# Patient Record
Sex: Female | Born: 2000
Health system: Southern US, Community
[De-identification: ages and names within clinical notes are randomized; demographics above are authoritative.]

## PROBLEM LIST (undated history)

## (undated) DIAGNOSIS — R109 Unspecified abdominal pain: Secondary | ICD-10-CM

## (undated) DIAGNOSIS — R197 Diarrhea, unspecified: Secondary | ICD-10-CM

## (undated) DIAGNOSIS — N390 Urinary tract infection, site not specified: Secondary | ICD-10-CM

## (undated) DIAGNOSIS — R51 Headache: Secondary | ICD-10-CM

## (undated) DIAGNOSIS — M2142 Flat foot [pes planus] (acquired), left foot: Secondary | ICD-10-CM

## (undated) DIAGNOSIS — F32A Depression, unspecified: Secondary | ICD-10-CM

## (undated) DIAGNOSIS — G43909 Migraine, unspecified, not intractable, without status migrainosus: Secondary | ICD-10-CM

## (undated) DIAGNOSIS — R87629 Unspecified abnormal cytological findings in specimens from vagina: Secondary | ICD-10-CM

## (undated) DIAGNOSIS — M419 Scoliosis, unspecified: Secondary | ICD-10-CM

## (undated) DIAGNOSIS — M2141 Flat foot [pes planus] (acquired), right foot: Secondary | ICD-10-CM

## (undated) DIAGNOSIS — M25562 Pain in left knee: Secondary | ICD-10-CM

## (undated) DIAGNOSIS — M357 Hypermobility syndrome: Secondary | ICD-10-CM

## (undated) DIAGNOSIS — F329 Major depressive disorder, single episode, unspecified: Secondary | ICD-10-CM

## (undated) HISTORY — DX: Unspecified abdominal pain: R10.9

## (undated) HISTORY — DX: Urinary tract infection, site not specified: N39.0

## (undated) HISTORY — DX: Flat foot (pes planus) (acquired), left foot: M21.42

## (undated) HISTORY — DX: Flat foot (pes planus) (acquired), right foot: M21.42

## (undated) HISTORY — DX: Depression, unspecified: F32.A

## (undated) HISTORY — DX: Headache: R51

## (undated) HISTORY — DX: Diarrhea, unspecified: R19.7

## (undated) HISTORY — DX: Pain in left knee: M25.562

## (undated) HISTORY — DX: Unspecified abnormal cytological findings in specimens from vagina: R87.629

## (undated) HISTORY — DX: Hypermobility syndrome: M35.7

## (undated) HISTORY — DX: Flat foot (pes planus) (acquired), right foot: M21.41

## (undated) HISTORY — DX: Major depressive disorder, single episode, unspecified: F32.9

---
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
## 2020-02-14 ENCOUNTER — Encounter (INDEPENDENT_AMBULATORY_CARE_PROVIDER_SITE_OTHER): Payer: Self-pay | Admitting: Internal Medicine

## 2020-02-14 ENCOUNTER — Ambulatory Visit (INDEPENDENT_AMBULATORY_CARE_PROVIDER_SITE_OTHER): Payer: 59 | Admitting: Internal Medicine

## 2020-02-14 ENCOUNTER — Other Ambulatory Visit: Payer: Self-pay

## 2020-02-14 VITALS — BP 100/70 | HR 107 | Temp 92.3°F | Ht 61.5 in | Wt 191.0 lb

## 2020-02-14 DIAGNOSIS — R5383 Other fatigue: Secondary | ICD-10-CM

## 2020-02-14 DIAGNOSIS — Z6830 Body mass index (BMI) 30.0-30.9, adult: Secondary | ICD-10-CM | POA: Diagnosis not present

## 2020-02-14 DIAGNOSIS — E559 Vitamin D deficiency, unspecified: Secondary | ICD-10-CM

## 2020-02-14 DIAGNOSIS — R5381 Other malaise: Secondary | ICD-10-CM

## 2020-02-14 DIAGNOSIS — G43829 Menstrual migraine, not intractable, without status migrainosus: Secondary | ICD-10-CM | POA: Diagnosis not present

## 2020-02-14 DIAGNOSIS — Z13220 Encounter for screening for lipoid disorders: Secondary | ICD-10-CM

## 2020-02-14 DIAGNOSIS — F329 Major depressive disorder, single episode, unspecified: Secondary | ICD-10-CM | POA: Diagnosis not present

## 2020-02-14 DIAGNOSIS — F32A Depression, unspecified: Secondary | ICD-10-CM

## 2020-02-14 DIAGNOSIS — Z131 Encounter for screening for diabetes mellitus: Secondary | ICD-10-CM

## 2020-02-14 DIAGNOSIS — N943 Premenstrual tension syndrome: Secondary | ICD-10-CM

## 2020-02-14 MED ORDER — PROGESTERONE 200 MG PO CAPS: 200.0000 mg | ORAL_CAPSULE | Freq: Every day | ORAL | 3 refills | Status: DC

## 2020-02-14 NOTE — Progress Notes (Signed)
Metrics: Intervention Frequency ACO  Documented Smoking Status Yearly  Screened one or more times in 24 months  Cessation Counseling or  Active cessation medication Past 24 months  Past 24 months   Guideline developer: UpToDate (See UpToDate for funding source) Date Released: 2014       Wellness Office Visit  Subjective:  Patient ID: [facility], female    DOB: 15-Feb-2001  Age: 19 y.o. MRN: [id number]  CC: This delightful 19 year old lady comes to our practice as a new patient to establish care.  She is going to turn 19 years old tomorrow. HPI She complains of fatigue, weight gain, constipation. She also complains of premenstrual syndrome and headaches relating to her cycles. She is being treated by psychiatry for bipolar disorder, anxiety.  She has insomnia and takes melatonin which seems to help her. 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  Past Medical History:  Diagnosis Date   Abdominal pain, recurrent    Asthma    since a baby,ususally with anxiety    Bilateral pes planus    Depression    Diagnosed since 9th grade but feels like she has had it since 6th grade   Diarrhea    Headache(784.0)    Hypermobility syndrome    Diagnosed by [facility]    Migraine    Patellofemoral arthralgia of left knee    Scoliosis    Urinary tract infection    Past Surgical History:  Procedure Laterality Date   KNEE ARTHROSCOPY WITH MEDIAL PATELLAR FEMORAL LIGAMENT RECONSTRUCTION Left 07/26/2018   Procedure: LEFT KNEE ARTHROSCOPY WITH MEDIAL PATELLAR FEMORAL LIGAMENT RECONSTRUCTION;  Surgeon: Hiram Gash, MD;  Location: [facility];  Service: Orthopedics;  Laterality: Left;   LABIOPLASTY Left 04/20/2018   Procedure: LEFT LABIAL REDUCTION;  Surgeon: Jonnie Kind, MD;  Location: [facility];  Service: Gynecology;  Laterality: Left;   URETERAL REIMPLANTION  2007   left-sided     Family History  Problem Relation Age  of Onset   Irritable bowel syndrome Brother    Asthma Brother    Ulcers Maternal Grandmother    Depression Maternal Grandmother    Cancer Maternal Grandmother    Hyperlipidemia Maternal Grandmother    Fibromyalgia Maternal Grandmother    Ulcers Paternal Grandfather    Hypertension Paternal Grandfather    Migraines Mother    Bipolar disorder Mother    Depression Mother    Anxiety disorder Mother    Fibromyalgia Mother    Alcohol abuse Maternal Grandfather    Depression Other    Depression Maternal Aunt     Social History   Social History Narrative   Lives with mother .[employer]-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         Social History   Tobacco Use   Smoking status: Former Smoker    Types: Cigarettes, E-cigarettes    Quit date: 07/17/2019    Years since quitting: 0.5   Smokeless tobacco: Never Used   Tobacco comment: mother vapes  Substance Use Topics   Alcohol use: Yes    Comment: once a month    Current Meds  Medication Sig   ascorbic acid (VITAMIN C) 100 MG tablet Take by mouth.   carbamazepine (TEGRETOL) 200 MG tablet Take 1 tablet (200 mg total) by mouth 2 (two) times daily.   clonazePAM (KLONOPIN) 0.5 MG tablet Take 1 tablet (0.5 mg total) by mouth daily as needed for anxiety.  FLUoxetine (PROZAC) 20 MG capsule Take 1 capsule (20 mg total) by mouth daily.   ibuprofen (ADVIL) 600 MG tablet Take 1 tablet (600 mg total) by mouth 3 (three) times daily.   loratadine (CLARITIN) 10 MG tablet Take 10 mg by mouth daily.    melatonin 5 MG TABS Take 5 mg by mouth at bedtime.   nystatin cream (MYCOSTATIN) Apply 1 application topically 2 (two) times daily.   polyethylene glycol powder (GLYCOLAX/MIRALAX) powder Take 17 g by mouth daily.       Depression screen [facility] 2/9 11/24/2019 08/29/2019  Decreased Interest 0 1  Down, Depressed, Hopeless 0 2  PHQ - 2 Score 0 3  Altered sleeping - 3  Tired, decreased energy - 3    Change in appetite - 3  Feeling bad or failure about yourself  - 2  Trouble concentrating - 0  Moving slowly or fidgety/restless - 0  PHQ-9 Score - 14     Objective:   Today's Vitals: BP 100/70 (BP Location: Left Arm, Patient Position: Sitting, Cuff Size: Normal)    Pulse (!) 107    Temp (!) 92.3 F (33.5 C) (Temporal)    Ht 5' 1.5" (1.562 m)    Wt 191 lb (86.6 kg)    SpO2 97%    BMI 35.50 kg/m  Vitals with BMI 02/14/2020 12/15/2019 11/24/2019  Height 5' 1.5" - 5\' 1"   Weight 191 lbs 193 lbs 192 lbs  BMI 35.51 36.49 36.3  Systolic 100 - 125  Diastolic 70 - 76  Pulse 107 -  Some encounter information is confidential and restricted. Go to Review Flowsheets activity to see all data.     Physical Exam   She looks systemically well.  She is obese.  Blood pressure is good.    Assessment   1. Menstrual migraine without status migrainosus, not intractable   2. Adolescent depression   3. Body mass index (BMI) of 30.0 to 30.9 in adult   4. Malaise and fatigue   5. Vitamin D deficiency disease   6. PMS (premenstrual syndrome)   7. Screening for diabetes mellitus   8. Screening for lipoid disorders       Tests ordered Orders Placed This Encounter  Procedures   CBC   COMPLETE METABOLIC PANEL WITH GFR   Hemoglobin A1c   T3, free   T4   TSH   VITAMIN D 25 Hydroxy (Vit-D Deficiency, Fractures)   Lipid panel     Plan: 1. For the menstrual migraines and PMS symptoms, I am going to prescribe her progesterone and she should take is around her cycles at night only.  I explained possible side effects which is mainly drowsiness. 2. Blood work is taken today.  I will see her in a couple of weeks time to review her blood work and further recommendations. 3. Today I spent 30 minutes with this new patient, reviewing her medical record, discussing her symptoms and formulating a plan of action.   Meds ordered this encounter  Medications   progesterone (PROMETRIUM) 200 MG  capsule    Sig: Take 1 capsule (200 mg total) by mouth daily.    Dispense:  30 capsule    Refill:  3    Rudolph Dobler 195, MD

## 2020-02-15 LAB — COMPLETE METABOLIC PANEL WITH GFR
AG Ratio: 1.6 (calc) (ref 1.0–2.5)
ALT: 12 U/L (ref 5–32)
AST: 10 U/L — ABNORMAL LOW (ref 12–32)
Albumin: 4.5 g/dL (ref 3.6–5.1)
Alkaline phosphatase (APISO): 128 U/L (ref 36–128)
BUN: 12 mg/dL (ref 7–20)
CO2: 26 mmol/L (ref 20–32)
Calcium: 9.9 mg/dL (ref 8.9–10.4)
Chloride: 104 mmol/L (ref 98–110)
Creat: 0.65 mg/dL (ref 0.50–1.00)
GFR, Est African American: 149 mL/min/{1.73_m2} (ref >=60)
GFR, Est Non African American: 129 mL/min/{1.73_m2} (ref >=60)
Globulin: 2.8 g/dL (calc) (ref 2.0–3.8)
Glucose, Bld: 89 mg/dL (ref 65–99)
Potassium: 4.5 mmol/L (ref 3.8–5.1)
Sodium: 139 mmol/L (ref 135–146)
Total Bilirubin: 0.3 mg/dL (ref 0.2–1.1)
Total Protein: 7.3 g/dL (ref 6.3–8.2)

## 2020-02-15 LAB — LIPID PANEL
Cholesterol: 209 mg/dL — ABNORMAL HIGH (ref <170)
HDL: 44 mg/dL — ABNORMAL LOW (ref >45)
LDL Cholesterol (Calc): 129 mg/dL (calc) — ABNORMAL HIGH (ref <110)
Non-HDL Cholesterol (Calc): 165 mg/dL (calc) — ABNORMAL HIGH (ref <120)
Total CHOL/HDL Ratio: 4.8 (calc) (ref <5.0)
Triglycerides: 224 mg/dL — ABNORMAL HIGH (ref <90)

## 2020-02-15 LAB — T3, FREE: T3, Free: 4 pg/mL (ref 3.0–4.7)

## 2020-02-15 LAB — CBC
HCT: 44.1 % (ref 35.0–45.0)
Hemoglobin: 14.6 g/dL (ref 11.7–15.5)
MCH: 28.7 pg (ref 27.0–33.0)
MCHC: 33.1 g/dL (ref 32.0–36.0)
MCV: 86.8 fL (ref 80.0–100.0)
MPV: 9.4 fL (ref 7.5–12.5)
Platelets: 360 10*3/uL (ref 140–400)
RBC: 5.08 10*6/uL (ref 3.80–5.10)
RDW: 11.8 % (ref 11.0–15.0)
WBC: 9.1 10*3/uL (ref 3.8–10.8)

## 2020-02-15 LAB — T4: T4, Total: 7.2 ug/dL (ref 5.3–11.7)

## 2020-02-15 LAB — HEMOGLOBIN A1C
Hgb A1c MFr Bld: 5.2 % of total Hgb (ref <5.7)
Mean Plasma Glucose: 103 (calc)
eAG (mmol/L): 5.7 (calc)

## 2020-02-15 LAB — VITAMIN D 25 HYDROXY (VIT D DEFICIENCY, FRACTURES): Vit D, 25-Hydroxy: 32 ng/mL (ref 30–100)

## 2020-02-15 LAB — TSH: TSH: 2.55 mIU/L

## 2020-02-28 ENCOUNTER — Ambulatory Visit (INDEPENDENT_AMBULATORY_CARE_PROVIDER_SITE_OTHER): Payer: 59 | Admitting: Internal Medicine

## 2020-02-28 ENCOUNTER — Encounter (INDEPENDENT_AMBULATORY_CARE_PROVIDER_SITE_OTHER): Payer: Self-pay | Admitting: Internal Medicine

## 2020-02-28 ENCOUNTER — Other Ambulatory Visit: Payer: Self-pay

## 2020-02-28 VITALS — BP 128/77 | HR 88 | Temp 97.5°F | Resp 18 | Ht 61.0 in | Wt 192.0 lb

## 2020-02-28 DIAGNOSIS — R5381 Other malaise: Secondary | ICD-10-CM

## 2020-02-28 DIAGNOSIS — E559 Vitamin D deficiency, unspecified: Secondary | ICD-10-CM

## 2020-02-28 DIAGNOSIS — E669 Obesity, unspecified: Secondary | ICD-10-CM

## 2020-02-28 DIAGNOSIS — R5383 Other fatigue: Secondary | ICD-10-CM

## 2020-02-28 DIAGNOSIS — N943 Premenstrual tension syndrome: Secondary | ICD-10-CM

## 2020-02-28 MED ORDER — THYROID 30 MG PO TABS
30.0000 mg | ORAL_TABLET | Freq: Every day | ORAL | 3 refills | Status: DC
Start: 2020-02-28 — End: 2020-03-12

## 2020-02-28 NOTE — Progress Notes (Signed)
Metrics: Intervention Frequency ACO  Documented Smoking Status Yearly  Screened one or more times in 24 months  Cessation Counseling or  Active cessation medication Past 24 months  Past 24 months   Guideline developer: UpToDate (See UpToDate for funding source) Date Released: 2014       Wellness Office Visit  Subjective:  Patient ID: [facility], female    DOB: 07/22/2001  Age: 19 y.o. MRN: [id number]  CC: This lady comes in for follow-up regarding her blood work and further recommendations. HPI  She has suboptimal vitamin D levels. She also has free T3 levels which are in the normal range but she clearly has symptoms of thyroid deficiency based on my conversation with her on the last visit. She has not tried her progesterone yet because she has not had a cycle yet.  She gets menstrual migraines. Past Medical History:  Diagnosis Date  . Abdominal pain, recurrent   . Asthma    since a baby,ususally with anxiety   . Bilateral pes planus   . Depression    Diagnosed since 9th grade but feels like she has had it since 6th grade  . Diarrhea   . Headache(784.0)   . Hypermobility syndrome    Diagnosed by [facility]   . Migraine   . Patellofemoral arthralgia of left knee   . Scoliosis   . Urinary tract infection    Past Surgical History:  Procedure Laterality Date  . KNEE ARTHROSCOPY WITH MEDIAL PATELLAR FEMORAL LIGAMENT RECONSTRUCTION Left 07/26/2018   Procedure: LEFT KNEE ARTHROSCOPY WITH MEDIAL PATELLAR FEMORAL LIGAMENT RECONSTRUCTION;  Surgeon: Hiram Gash, MD;  Location: [place];  Service: Orthopedics;  Laterality: Left;  . LABIOPLASTY Left 04/20/2018   Procedure: LEFT LABIAL REDUCTION;  Surgeon: Jonnie Kind, MD;  Location: [facility];  Service: Gynecology;  Laterality: Left;  . URETERAL REIMPLANTION  2007   left-sided     Family History  Problem Relation Age of Onset  . Irritable bowel syndrome Brother   . Asthma Brother   . Ulcers  Maternal Grandmother   . Depression Maternal Grandmother   . Cancer Maternal Grandmother   . Hyperlipidemia Maternal Grandmother   . Fibromyalgia Maternal Grandmother   . Ulcers Paternal Grandfather   . Hypertension Paternal Grandfather   . Migraines Mother   . Bipolar disorder Mother   . Depression Mother   . Anxiety disorder Mother   . Fibromyalgia Mother   . Alcohol abuse Maternal Grandfather   . Depression Other   . 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         Social History   Tobacco Use  . Smoking status: Former Smoker    Types: Cigarettes, E-cigarettes    Quit date: 07/17/2019    Years since quitting: 0.6  . Smokeless tobacco: Never Used  . Tobacco comment: mother vapes  Substance Use Topics  . Alcohol use: Yes    Comment: once a month    Current Meds  Medication Sig  . ascorbic acid (VITAMIN C) 100 MG tablet Take by mouth.  . carbamazepine (TEGRETOL) 200 MG tablet Take 1 tablet (200 mg total) by mouth 2 (two) times daily.  . clonazePAM (KLONOPIN) 0.5 MG tablet Take 1 tablet (0.5 mg total) by mouth daily as needed for anxiety.  Marland Kitchen FLUoxetine (PROZAC) 20 MG capsule Take 1 capsule (20 mg  total) by mouth daily.  Marland Kitchen ibuprofen (ADVIL) 600 MG tablet Take 1 tablet (600 mg total) by mouth 3 (three) times daily.  Marland Kitchen loratadine (CLARITIN) 10 MG tablet Take 10 mg by mouth daily.   . melatonin 5 MG TABS Take 5 mg by mouth at bedtime.  Marland Kitchen nystatin cream (MYCOSTATIN) Apply 1 application topically 2 (two) times daily.  . polyethylene glycol powder (GLYCOLAX/MIRALAX) powder Take 17 g by mouth daily.  . progesterone (PROMETRIUM) 200 MG capsule Take 1 capsule (200 mg total) by mouth daily.       Depression screen [facility] 2/9 02/28/2020 11/24/2019 08/29/2019  Decreased Interest 1 0 1  Down, Depressed, Hopeless 2 0 2  PHQ - 2 Score 3 0 3  Altered sleeping 3 - 3    Tired, decreased energy 3 - 3  Change in appetite 3 - 3  Feeling bad or failure about yourself  3 - 2  Trouble concentrating 3 - 0  Moving slowly or fidgety/restless 1 - 0  Suicidal thoughts 0 - -  PHQ-9 Score 19 - 14     Objective:   Today's Vitals: BP 128/77 (BP Location: Right Arm, Patient Position: Sitting, Cuff Size: Normal)   Pulse 88   Temp (!) 97.5 F (36.4 C) (Temporal)   Resp 18   Ht 5\' 1"  (1.549 m)   Wt 192 lb (87.1 kg)   SpO2 98%   BMI 36.28 kg/m  Vitals with BMI 02/28/2020 02/14/2020 12/15/2019  Height 5\' 1"  5' 1.5" -  Weight 192 lbs 191 lbs 193 lbs  BMI 36.3 35.51 36.49  Systolic 128 100 -  Diastolic 77 70 -  Pulse 88 107 -  Some encounter information is confidential and restricted. Go to Review Flowsheets activity to see all data.     Physical Exam   She looks systemically well.  She remains obese.  Weight is unchanged.    Assessment   1. Vitamin D deficiency disease   2. Malaise and fatigue   3. PMS (premenstrual syndrome)   4. Obesity (BMI 30-39.9)       Tests ordered No orders of the defined types were placed in this encounter.    Plan: 1. I recommended she start taking vitamin D3 10,000 units daily. 2. Based on her symptoms, I am going to prescribe for her desiccated NP thyroid, off label, for symptoms of thyroid deficiency.  I explained the rationale to her regarding this. 3. As far as her obesity is concerned, I told her she needs to stop drinking soft drinks and also try to eat healthier.  I have given her diet sheet and we will discuss this on the next visit. 4. Follow-up in about a month's time.   Meds ordered this encounter  Medications  . thyroid (NP THYROID) 30 MG tablet    Sig: Take 1 tablet (30 mg total) by mouth daily before breakfast.    Dispense:  30 tablet    Refill:  3    Ben Habermann 02/14/2020, MD

## 2020-02-28 NOTE — Patient Instructions (Addendum)
[facility] Dietary Recommendations for Weight Loss What to Avoid . Avoid added sugars o Often added sugar can be found in processed foods such as many condiments, dry cereals, cakes, cookies, chips, crisps, crackers, candies, sweetened drinks, etc.  o Read labels and AVOID/DECREASE use of foods with the following in their ingredient list: Sugar, fructose, high fructose corn syrup, sucrose, glucose, maltose, dextrose, molasses, cane sugar, brown sugar, any type of syrup, agave nectar, etc.   . Avoid snacking in between meals . Avoid foods made with flour o If you are going to eat food made with flour, choose those made with whole-grains; and, minimize your consumption as much as is tolerable . Avoid processed foods o These foods are generally stocked in the middle of the grocery store. Focus on shopping on the perimeter of the grocery.  . Avoid Meat  o We recommend following a plant-based diet at [facility]. Thus, we recommend avoiding meat as a general rule. Consider eating beans, legumes, eggs, and/or dairy products for regular protein sources o If you plan on eating meat limit to 4 ounces of meat at a time and choose lean options such as Fish, chicken, turkey. Avoid red meat intake such as pork and/or steak What to Include . Vegetables o GREEN LEAFY VEGETABLES: Kale, spinach, mustard greens, collard greens, cabbage, broccoli, etc. o OTHER: Asparagus, cauliflower, eggplant, carrots, peas, Brussel sprouts, tomatoes, bell peppers, zucchini, beets, cucumbers, etc. . Grains, seeds, and legumes o Beans: kidney beans, black eyed peas, garbanzo beans, black beans, pinto beans, etc. o Whole, unrefined grains: brown rice, barley, bulgur, oatmeal, etc. . Healthy fats  o Avoid highly processed fats such as vegetable oil o Examples of healthy fats: avocado, olives, virgin olive oil, dark chocolate (?72% Cocoa), nuts (peanuts, almonds, walnuts, cashews, pecans, etc.) . None to Low  Intake of Animal Sources of Protein o Meat sources: chicken, turkey, salmon, tuna. Limit to 4 ounces of meat at one time. o Consider limiting dairy sources, but when choosing dairy focus on: PLAIN Greek yogurt, cottage cheese, high-protein milk . Fruit o Choose berries  When to Eat . Intermittent Fasting: o Choosing not to eat for a specific time period, but DO FOCUS ON HYDRATION when fasting o Multiple Techniques: - Time Restricted Eating: eat 3 meals in a day, each meal lasting no more than 60 minutes, no snacks between meals - 16-18 hour fast: fast for 16 to 18 hours up to 7 days a week. Often suggested to start with 2-3 nonconsecutive days per week.  . Remember the time you sleep is counted as fasting.  . Examples of eating schedule: Fast from 7:00pm-11:00am. Eat between 11:00am-7:00pm.  - 24-hour fast: fast for 24 hours up to every other day. Often suggested to start with 1 day per week . Remember the time you sleep is counted as fasting . Examples of eating schedule:  o Eating day: eat 2-3 meals on your eating day. If doing 2 meals, each meal should last no more than 90 minutes. If doing 3 meals, each meal should last no more than 60 minutes. Finish last meal by 7:00pm. o Fasting day: Fast until 7:00pm.  o IF YOU FEEL UNWELL FOR ANY REASON/IN ANY WAY WHEN FASTING, STOP FASTING BY EATING A NUTRITIOUS SNACK OR LIGHT MEAL o ALWAYS FOCUS ON HYDRATION DURING FASTS - Acceptable Hydration sources: water, broths, tea/coffee (black tea/coffee is best but using a small amount of whole-fat dairy products in coffee/tea is acceptable).  -   Poor Hydration Sources: anything with sugar or artificial sweeteners added to it  These recommendations have been developed for patients that are actively receiving medical care from either Dr. Shalamar Plourde or Sarah Gray, DNP, NP-C at [facility]. These recommendations are developed for patients with specific medical conditions and are not meant to be  distributed or used by others that are not actively receiving care from either provider listed above at [facility]. It is not appropriate to participate in the above eating plans without proper medical supervision.   Reference: Fung, J. The obesity code. [place]: [place]; 2016.   VITAMIN D3 10,000 UNITS/DAY 

## 2020-03-12 ENCOUNTER — Other Ambulatory Visit (INDEPENDENT_AMBULATORY_CARE_PROVIDER_SITE_OTHER): Payer: Self-pay

## 2020-03-12 MED ORDER — PROGESTERONE 200 MG PO CAPS: 200.0000 mg | ORAL_CAPSULE | Freq: Every day | ORAL | 3 refills | Status: DC

## 2020-03-12 MED ORDER — THYROID 30 MG PO TABS: 30.0000 mg | ORAL_TABLET | Freq: Every day | ORAL | 3 refills | Status: DC

## 2020-03-15 ENCOUNTER — Telehealth (HOSPITAL_COMMUNITY): Payer: Self-pay | Admitting: *Deleted

## 2020-03-15 ENCOUNTER — Other Ambulatory Visit (HOSPITAL_COMMUNITY): Payer: Self-pay | Admitting: Psychiatry

## 2020-03-15 MED ORDER — CARBAMAZEPINE 200 MG PO TABS: 200.0000 mg | ORAL_TABLET | Freq: Two times a day (BID) | ORAL | 2 refills | Status: DC

## 2020-03-15 MED ORDER — CLONAZEPAM 0.5 MG PO TABS: 0.5000 mg | ORAL_TABLET | Freq: Every day | ORAL | 2 refills | Status: DC | PRN

## 2020-03-15 MED ORDER — FLUOXETINE HCL 20 MG PO CAPS: 20.0000 mg | ORAL_CAPSULE | Freq: Every day | ORAL | 2 refills | Status: DC

## 2020-03-15 NOTE — Telephone Encounter (Signed)
LMOM for patient to call office to schedule appt per provider due to pharmacy needing refills.

## 2020-03-27 ENCOUNTER — Ambulatory Visit (INDEPENDENT_AMBULATORY_CARE_PROVIDER_SITE_OTHER): Payer: 59 | Admitting: Internal Medicine

## 2020-03-27 ENCOUNTER — Other Ambulatory Visit: Payer: Self-pay

## 2020-03-27 ENCOUNTER — Encounter (INDEPENDENT_AMBULATORY_CARE_PROVIDER_SITE_OTHER): Payer: Self-pay | Admitting: Internal Medicine

## 2020-03-27 VITALS — BP 120/75 | HR 114 | Temp 97.3°F | Ht 61.0 in | Wt 190.6 lb

## 2020-03-27 DIAGNOSIS — R5381 Other malaise: Secondary | ICD-10-CM

## 2020-03-27 DIAGNOSIS — E669 Obesity, unspecified: Secondary | ICD-10-CM

## 2020-03-27 DIAGNOSIS — E559 Vitamin D deficiency, unspecified: Secondary | ICD-10-CM | POA: Diagnosis not present

## 2020-03-27 DIAGNOSIS — N943 Premenstrual tension syndrome: Secondary | ICD-10-CM | POA: Diagnosis not present

## 2020-03-27 DIAGNOSIS — T63301A Toxic effect of unspecified spider venom, accidental (unintentional), initial encounter: Secondary | ICD-10-CM

## 2020-03-27 DIAGNOSIS — R5383 Other fatigue: Secondary | ICD-10-CM

## 2020-03-27 NOTE — Patient Instructions (Signed)
[facility] Dietary Recommendations for Weight Loss What to Avoid . Avoid added sugars o Often added sugar can be found in processed foods such as many condiments, dry cereals, cakes, cookies, chips, crisps, crackers, candies, sweetened drinks, etc.  o Read labels and AVOID/DECREASE use of foods with the following in their ingredient list: Sugar, fructose, high fructose corn syrup, sucrose, glucose, maltose, dextrose, molasses, cane sugar, brown sugar, any type of syrup, agave nectar, etc.   . Avoid snacking in between meals . Avoid foods made with flour o If you are going to eat food made with flour, choose those made with whole-grains; and, minimize your consumption as much as is tolerable . Avoid processed foods o These foods are generally stocked in the middle of the grocery store. Focus on shopping on the perimeter of the grocery.  . Avoid Meat  o We recommend following a plant-based diet at [facility]. Thus, we recommend avoiding meat as a general rule. Consider eating beans, legumes, eggs, and/or dairy products for regular protein sources o If you plan on eating meat limit to 4 ounces of meat at a time and choose lean options such as Fish, chicken, turkey. Avoid red meat intake such as pork and/or steak What to Include . Vegetables o GREEN LEAFY VEGETABLES: Kale, spinach, mustard greens, collard greens, cabbage, broccoli, etc. o OTHER: Asparagus, cauliflower, eggplant, carrots, peas, Brussel sprouts, tomatoes, bell peppers, zucchini, beets, cucumbers, etc. . Grains, seeds, and legumes o Beans: kidney beans, black eyed peas, garbanzo beans, black beans, pinto beans, etc. o Whole, unrefined grains: brown rice, barley, bulgur, oatmeal, etc. . Healthy fats  o Avoid highly processed fats such as vegetable oil o Examples of healthy fats: avocado, olives, virgin olive oil, dark chocolate (?72% Cocoa), nuts (peanuts, almonds, walnuts, cashews, pecans, etc.) . None to Low  Intake of Animal Sources of Protein o Meat sources: chicken, turkey, salmon, tuna. Limit to 4 ounces of meat at one time. o Consider limiting dairy sources, but when choosing dairy focus on: PLAIN Greek yogurt, cottage cheese, high-protein milk . Fruit o Choose berries  When to Eat . Intermittent Fasting: o Choosing not to eat for a specific time period, but DO FOCUS ON HYDRATION when fasting o Multiple Techniques: - Time Restricted Eating: eat 3 meals in a day, each meal lasting no more than 60 minutes, no snacks between meals - 16-18 hour fast: fast for 16 to 18 hours up to 7 days a week. Often suggested to start with 2-3 nonconsecutive days per week.  . Remember the time you sleep is counted as fasting.  . Examples of eating schedule: Fast from 7:00pm-11:00am. Eat between 11:00am-7:00pm.  - 24-hour fast: fast for 24 hours up to every other day. Often suggested to start with 1 day per week . Remember the time you sleep is counted as fasting . Examples of eating schedule:  o Eating day: eat 2-3 meals on your eating day. If doing 2 meals, each meal should last no more than 90 minutes. If doing 3 meals, each meal should last no more than 60 minutes. Finish last meal by 7:00pm. o Fasting day: Fast until 7:00pm.  o IF YOU FEEL UNWELL FOR ANY REASON/IN ANY WAY WHEN FASTING, STOP FASTING BY EATING A NUTRITIOUS SNACK OR LIGHT MEAL o ALWAYS FOCUS ON HYDRATION DURING FASTS - Acceptable Hydration sources: water, broths, tea/coffee (black tea/coffee is best but using a small amount of whole-fat dairy products in coffee/tea is acceptable).  -   Poor Hydration Sources: anything with sugar or artificial sweeteners added to it  These recommendations have been developed for patients that are actively receiving medical care from either Dr. Osher Oettinger or Sarah Gray, DNP, NP-C at [facility]. These recommendations are developed for patients with specific medical conditions and are not meant to be  distributed or used by others that are not actively receiving care from either provider listed above at [facility]. It is not appropriate to participate in the above eating plans without proper medical supervision.   Reference: Fung, J. The obesity code. [place]: [place]; 2016.   

## 2020-03-27 NOTE — Progress Notes (Signed)
Metrics: Intervention Frequency ACO  Documented Smoking Status Yearly  Screened one or more times in 24 months  Cessation Counseling or  Active cessation medication Past 24 months  Past 24 months   Guideline developer: UpToDate (See UpToDate for funding source) Date Released: 2014       Wellness Office Visit  Subjective:  Patient ID: Victoria Key, female    DOB: Feb 16, 2001  Age: 19 y.o. MRN: [id number]  CC: This lady comes in for follow-up of vitamin D deficiency, obesity, symptoms of thyroid deficiency. HPI  She has been taking vitamin D3 10,000 units daily. She tells me that the progesterone dose of 200 mg did not help her PMS symptoms significantly. She has been taking desiccated NP thyroid, off label, for symptoms of thyroid deficiency and she has tolerated it well. She also wanted me to look at an insect bite, probably a spider, that she sustained about a month ago on the second right toe.  There is inflammation there and it has not resolved.  She denies any fever or systemic symptoms. In terms of her obesity, she has stopped drinking soda drinks now. Past Medical History:  Diagnosis Date  . Abdominal pain, recurrent   . Asthma    since a baby,ususally with anxiety   . Bilateral pes planus   . Depression    Diagnosed since 9th grade but feels like she has had it since 6th grade  . Diarrhea   . Headache(784.0)   . Hypermobility syndrome    Diagnosed by [facility]   . Migraine   . Patellofemoral arthralgia of left knee   . Scoliosis   . Urinary tract infection    Past Surgical History:  Procedure Laterality Date  . KNEE ARTHROSCOPY WITH MEDIAL PATELLAR FEMORAL LIGAMENT RECONSTRUCTION Left 07/26/2018   Procedure: LEFT KNEE ARTHROSCOPY WITH MEDIAL PATELLAR FEMORAL LIGAMENT RECONSTRUCTION;  Surgeon: Bjorn Pippin, MD;  Location:  SURGERY CENTER;  Service: Orthopedics;  Laterality: Left;  . LABIOPLASTY Left 04/20/2018   Procedure: LEFT LABIAL REDUCTION;   Surgeon: [name], MD;  Location: [facility];  Service: Gynecology;  Laterality: Left;  . URETERAL REIMPLANTION  2007   left-sided     Family History  Problem Relation Age of Onset  . Irritable bowel syndrome Brother   . Asthma Brother   . Ulcers Maternal Grandmother   . Depression Maternal Grandmother   . Cancer Maternal Grandmother   . Hyperlipidemia Maternal Grandmother   . Fibromyalgia Maternal Grandmother   . Ulcers Paternal Grandfather   . Hypertension Paternal Grandfather   . Migraines Mother   . Bipolar disorder Mother   . Depression Mother   . Anxiety disorder Mother   . Fibromyalgia Mother   . Alcohol abuse Maternal Grandfather   . Depression Other   . 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         Social History   Tobacco Use  . Smoking status: Former Smoker    Types: Cigarettes, E-cigarettes    Quit date: 07/17/2019    Years since quitting: 0.6  . Smokeless tobacco: Never Used  . Tobacco comment: mother vapes  Substance Use Topics  . Alcohol use: Yes    Comment: once a month    Current Meds  Medication Sig  . ascorbic acid (VITAMIN C) 100 MG tablet Take by mouth.  . carbamazepine (TEGRETOL)  200 MG tablet Take 1 tablet (200 mg total) by mouth 2 (two) times daily.  . clonazePAM (KLONOPIN) 0.5 MG tablet Take 1 tablet (0.5 mg total) by mouth daily as needed for anxiety.  Marland Kitchen FLUoxetine (PROZAC) 20 MG capsule Take 1 capsule (20 mg total) by mouth daily.  Marland Kitchen ibuprofen (ADVIL) 600 MG tablet Take 1 tablet (600 mg total) by mouth 3 (three) times daily.  Marland Kitchen loratadine (CLARITIN) 10 MG tablet Take 10 mg by mouth daily.   . melatonin 5 MG TABS Take 5 mg by mouth at bedtime.  Marland Kitchen nystatin cream (MYCOSTATIN) Apply 1 application topically 2 (two) times daily.  . polyethylene glycol powder (GLYCOLAX/MIRALAX) powder Take 17 g by mouth daily.  .  progesterone (PROMETRIUM) 200 MG capsule Take 1 capsule (200 mg total) by mouth daily.  Marland Kitchen thyroid (NP THYROID) 30 MG tablet Take 1 tablet (30 mg total) by mouth daily before breakfast.      Depression screen [facility] 2/9 02/28/2020 11/24/2019 08/29/2019  Decreased Interest 1 0 1  Down, Depressed, Hopeless 2 0 2  PHQ - 2 Score 3 0 3  Altered sleeping 3 - 3  Tired, decreased energy 3 - 3  Change in appetite 3 - 3  Feeling bad or failure about yourself  3 - 2  Trouble concentrating 3 - 0  Moving slowly or fidgety/restless 1 - 0  Suicidal thoughts 0 - -  PHQ-9 Score 19 - 14     Objective:   Today's Vitals: BP 120/75 (BP Location: Left Arm, Patient Position: Sitting, Cuff Size: Normal)   Pulse (!) 114   Temp (!) 97.3 F (36.3 C) (Temporal)   Ht 5\' 1"  (1.549 m)   Wt 190 lb 9.6 oz (86.5 kg)   SpO2 98%   BMI 36.01 kg/m  Vitals with BMI 03/27/2020 02/28/2020 02/14/2020  Height 5\' 1"  5\' 1"  5' 1.5"  Weight 190 lbs 10 oz 192 lbs 191 lbs  BMI 36.03 36.3 35.51  Systolic 120 128 04/15/2020  Diastolic 75 77 70  Pulse 114 88 107  Some encounter information is confidential and restricted. Go to Review Flowsheets activity to see all data.     Physical Exam  She looks systemically well, she remains obese but she has lost 2 pounds. I examined the second right toe and there is about 0.5 cm erythematous round area on the dorsal aspect which does not appear to have surrounding cellulitis.  I think it looks more inflammatory response rather than anything else.     Assessment   1. Obesity (BMI 30-39.9)   2. Vitamin D deficiency disease   3. Malaise and fatigue   4. PMS (premenstrual syndrome)   5. Spider bite wound, accidental or unintentional, initial encounter       Tests ordered Orders Placed This Encounter  Procedures  . T3, free  . COMPLETE METABOLIC PANEL WITH GFR  . VITAMIN D 25 Hydroxy (Vit-D Deficiency, Fractures)     Plan: 1. As far as her obesity is concerned we discussed the  concept of intermittent fasting combined with more of a plant-based diet.  Hopefully she will start doing this. 2. She will continue with vitamin D3 10,000 units daily and I will check vitamin D levels today. 3. She will continue with NP thyroid 30 mg daily and we will check T3 levels today and we may need to adjust the dose. 4. In terms of her PMS, I suggested the next time she has a cycle, she take  progesterone 40 mg at night and see if that will help her. 5. In terms of the spider bite, I recommended hydrocortisone cream over-the-counter and if she still does not improve in the next week or so, she will let me know and I will refer to dermatology. 6. Follow-up in 6 weeks.   No orders of the defined types were placed in this encounter.   Wilson Singer, MD

## 2020-03-28 LAB — COMPLETE METABOLIC PANEL WITH GFR
AG Ratio: 1.7 (calc) (ref 1.0–2.5)
ALT: 20 U/L (ref 5–32)
AST: 14 U/L (ref 12–32)
Albumin: 4.5 g/dL (ref 3.6–5.1)
Alkaline phosphatase (APISO): 94 U/L (ref 36–128)
BUN: 9 mg/dL (ref 7–20)
CO2: 23 mmol/L (ref 20–32)
Calcium: 9.8 mg/dL (ref 8.9–10.4)
Chloride: 105 mmol/L (ref 98–110)
Creat: 0.74 mg/dL (ref 0.50–1.00)
GFR, Est African American: 136 mL/min/{1.73_m2} (ref >=60)
GFR, Est Non African American: 117 mL/min/{1.73_m2} (ref >=60)
Globulin: 2.6 g/dL (calc) (ref 2.0–3.8)
Glucose, Bld: 101 mg/dL — ABNORMAL HIGH (ref 65–99)
Potassium: 4.6 mmol/L (ref 3.8–5.1)
Sodium: 138 mmol/L (ref 135–146)
Total Bilirubin: 0.3 mg/dL (ref 0.2–1.1)
Total Protein: 7.1 g/dL (ref 6.3–8.2)

## 2020-03-28 LAB — T3, FREE: T3, Free: 3.7 pg/mL (ref 3.0–4.7)

## 2020-03-28 LAB — VITAMIN D 25 HYDROXY (VIT D DEFICIENCY, FRACTURES): Vit D, 25-Hydroxy: 49 ng/mL (ref 30–100)

## 2020-04-11 ENCOUNTER — Encounter: Payer: Self-pay | Admitting: Emergency Medicine

## 2020-04-11 ENCOUNTER — Ambulatory Visit
Admission: EM | Admit: 2020-04-11 | Discharge: 2020-04-11 | Disposition: A | Discharge status: Home / Self Care | Payer: 59 | Attending: Emergency Medicine | Admitting: Emergency Medicine

## 2020-04-11 ENCOUNTER — Other Ambulatory Visit: Payer: Self-pay

## 2020-04-11 DIAGNOSIS — J069 Acute upper respiratory infection, unspecified: Secondary | ICD-10-CM | POA: Diagnosis present

## 2020-04-11 LAB — POCT RAPID STREP A (OFFICE): Rapid Strep A Screen: NEGATIVE

## 2020-04-11 MED ORDER — BENZONATATE 100 MG PO CAPS
100.0000 mg | ORAL_CAPSULE | Freq: Three times a day (TID) | ORAL | 0 refills | Status: DC
Start: 2020-04-11 — End: 2020-04-19

## 2020-04-11 NOTE — Discharge Instructions (Signed)
Strep negative.  Culture pending.  We will contact you with abnormal results COVID testing ordered.  It will take between 2-5  days for test results.  Someone will contact you regarding abnormal results.    In the meantime: You should remain isolated in your home for 10 days from symptom onset AND greater than 72 hours after symptoms resolution (absence of fever without the use of fever-reducing medication and improvement in respiratory symptoms), whichever is longer Get plenty of rest and push fluids Tessalon Perles prescribed for cough Use OTC zyrtec for nasal congestion, runny nose, and/or sore throat Use OTC flonase for nasal congestion and runny nose Use medications daily for symptom relief Use OTC medications like ibuprofen or tylenol as needed fever or pain Call or go to the ED if you have any new or worsening symptoms such as fever, worsening cough, shortness of breath, chest tightness, chest pain, turning blue, changes in mental status, etc..Marland Kitchen

## 2020-04-11 NOTE — ED Triage Notes (Signed)
Sore throat, cough and fatigue that started a couple of days ago and continues to get worse. Pt had covid Dec 2020

## 2020-04-11 NOTE — ED Provider Notes (Signed)
[facility]   [id number] 04/11/20 Arrival Time: 1327   CC: COVID symptoms  SUBJECTIVE: History from: patient.  Victoria Key is a 19 y.o. female who presents with sore throat, congestion, cough, and fatigue x 2 days.  Denies sick exposure to COVID, flu or strep.  Has NOT tried OTC medications.  Symptoms are made worse with swallowing, but tolerating own secretions and liquids.  Reports previous COVID infection.   Denies fever, chills, rhinorrhea, SOB, wheezing, chest pain, nausea, changes in bowel or bladder habits.    ROS: As per HPI.  All other pertinent ROS negative.     Past Medical History:  Diagnosis Date  . Abdominal pain, recurrent   . Asthma    since a baby,ususally with anxiety   . Bilateral pes planus   . Depression    Diagnosed since 9th grade but feels like she has had it since 6th grade  . Diarrhea   . Headache(784.0)   . Hypermobility syndrome    Diagnosed by [facility] Rheumat[facility]   . Migraine   . Patellofemoral arthralgia of left knee   . Scoliosis   . Urinary tract infection    Past Surgical History:  Procedure Laterality Date  . KNEE ARTHROSCOPY WITH MEDIAL PATELLAR FEMORAL LIGAMENT RECONSTRUCTION Left 07/26/2018   Procedure: LEFT KNEE ARTHROSCOPY WITH MEDIAL PATELLAR FEMORAL LIGAMENT RECONSTRUCTION;  Surgeon: Bjorn Pippin, MD;  Location:  SURGERY CENTER;  Service: Orthopedics;  Laterality: Left;  . LABIOPLASTY Left 04/20/2018   Procedure: LEFT LABIAL REDUCTION;  Surgeon: [name], MD;  Location: [facility];  Service: Gynecology;  Laterality: Left;  . URETERAL REIMPLANTION  2007   left-sided   Allergies  Allergen Reactions  . Augmentin [Amoxicillin-Pot Clavulanate] Other (See Comments)    Possible serum like sickness vs angioedema  Has patient had a PCN reaction causing immediate rash, facial/tongue/throat swelling, SOB or lightheadedness with hypotension: Yes Has patient had a PCN reaction causing severe rash involving mucus  membranes or skin necrosis: No Has patient had a PCN reaction that required hospitalization: Yes Has patient had a PCN reaction occurring within the last 10 years: Yes If all of the above answers are "NO", then may proceed with Cephalosporin use.   . Adhesive [Tape] Rash  . Latex Rash   No current facility-administered medications on file prior to encounter.   Current Outpatient Medications on File Prior to Encounter  Medication Sig Dispense Refill  . ascorbic acid (VITAMIN C) 100 MG tablet Take by mouth.    . carbamazepine (TEGRETOL) 200 MG tablet Take 1 tablet (200 mg total) by mouth 2 (two) times daily. 180 tablet 2  . clonazePAM (KLONOPIN) 0.5 MG tablet Take 1 tablet (0.5 mg total) by mouth daily as needed for anxiety. 30 tablet 2  . FLUoxetine (PROZAC) 20 MG capsule Take 1 capsule (20 mg total) by mouth daily. 90 capsule 2  . ibuprofen (ADVIL) 600 MG tablet Take 1 tablet (600 mg total) by mouth 3 (three) times daily. 60 tablet 3  . loratadine (CLARITIN) 10 MG tablet Take 10 mg by mouth daily.     . melatonin 5 MG TABS Take 5 mg by mouth at bedtime.    Marland Kitchen nystatin cream (MYCOSTATIN) Apply 1 application topically 2 (two) times daily. 30 g 0  . polyethylene glycol powder (GLYCOLAX/MIRALAX) powder Take 17 g by mouth daily. 3350 g 1  . progesterone (PROMETRIUM) 200 MG capsule Take 1 capsule (200 mg total) by mouth daily. 30 capsule 3  .  thyroid (NP THYROID) 30 MG tablet Take 1 tablet (30 mg total) by mouth daily before breakfast. 30 tablet 3   Social History   Socioeconomic History  . Marital status: Single    Spouse name: Not on file  . Number of children: Not on file  . Years of education: Not on file  . Highest education level: Not on file  Occupational History  . Not on file  Tobacco Use  . Smoking status: Former Smoker    Types: Cigarettes, E-cigarettes    Quit date: 07/17/2019    Years since quitting: 0.7  . Smokeless tobacco: Never Used  . Tobacco comment: mother vapes    Vaping Use  . Vaping Use: Every day  . Start date: 09/15/2017  Substance and Sexual Activity  . Alcohol use: Yes    Comment: once a month  . Drug use: No  . Sexual activity: Yes    Birth control/protection: Pill, Condom  Other Topics Concern  . 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         Social Determinants of Health   Financial Resource Strain:   . Difficulty of Paying Living Expenses:   Food Insecurity:   . Worried About Programme researcher, broadcasting/film/video in the Last Year:   . Barista in the Last Year:   Transportation Needs:   . Freight forwarder (Medical):   Marland Kitchen Lack of Transportation (Non-Medical):   Physical Activity:   . Days of Exercise per Week:   . Minutes of Exercise per Session:   Stress:   . Feeling of Stress :   Social Connections:   . Frequency of Communication with Friends and Family:   . Frequency of Social Gatherings with Friends and Family:   . Attends Religious Services:   . Active Member of Clubs or Organizations:   . Attends Banker Meetings:   Marland Kitchen Marital Status:   Intimate Partner Violence:   . Fear of Current or Ex-Partner:   . Emotionally Abused:   Marland Kitchen Physically Abused:   . Sexually Abused:    Family History  Problem Relation Age of Onset  . Irritable bowel syndrome Brother   . Asthma Brother   . Ulcers Maternal Grandmother   . Depression Maternal Grandmother   . Cancer Maternal Grandmother   . Hyperlipidemia Maternal Grandmother   . Fibromyalgia Maternal Grandmother   . Ulcers Paternal Grandfather   . Hypertension Paternal Grandfather   . Migraines Mother   . Bipolar disorder Mother   . Depression Mother   . Anxiety disorder Mother   . Fibromyalgia Mother   . Alcohol abuse Maternal Grandfather   . Depression Other   . Depression Maternal Aunt     OBJECTIVE:  Vitals:   04/11/20 1342 04/11/20 1343  BP:  122/79  Pulse:  101  Resp:  18   Temp:  98.8 F (37.1 C)  TempSrc:  Oral  SpO2:  97%  Weight: 190 lb (86.2 kg)   Height: 5\' 1"  (1.549 m)      General appearance: alert; appears mildly fatigued, but nontoxic; speaking in full sentences and tolerating own secretions HEENT: NCAT; Ears: EACs clear, TMs pearly gray; Eyes: PERRL.  EOM grossly intact. Nose: nares patent without rhinorrhea, Throat: oropharynx clear, tonsils mildly erythematous or enlarged, uvula midline  Neck: supple without LAD Lungs: unlabored respirations, symmetrical air entry; cough: absent; no respiratory distress;  CTAB Heart: regular rate and rhythm.   Skin: warm and dry Psychological: alert and cooperative; normal mood and affect  LABS:  Results for orders placed or performed during the hospital encounter of 04/11/20 (from the past 24 hour(s))  POCT rapid strep A     Status: Normal   Collection Time: 04/11/20  1:54 PM  Result Value Ref Range   Rapid Strep A Screen Negative Negative     ASSESSMENT & PLAN:  1. Viral URI with cough     Meds ordered this encounter  Medications  . benzonatate (TESSALON) 100 MG capsule    Sig: Take 1 capsule (100 mg total) by mouth every 8 (eight) hours.    Dispense:  21 capsule    Refill:  0    Order Specific Question:   Supervising Provider    Answer:   Eustace Moore [[id number]]   Strep negative.  Culture pending.  We will contact you with abnormal results COVID testing ordered.  It will take between 2-5  days for test results.  Someone will contact you regarding abnormal results.    In the meantime: You should remain isolated in your home for 10 days from symptom onset AND greater than 72 hours after symptoms resolution (absence of fever without the use of fever-reducing medication and improvement in respiratory symptoms), whichever is longer Get plenty of rest and push fluids Tessalon Perles prescribed for cough Use OTC zyrtec for nasal congestion, runny nose, and/or sore throat Use OTC flonase for  nasal congestion and runny nose Use medications daily for symptom relief Use OTC medications like ibuprofen or tylenol as needed fever or pain Call or go to the ED if you have any new or worsening symptoms such as fever, worsening cough, shortness of breath, chest tightness, chest pain, turning blue, changes in mental status, etc...   Reviewed expectations re: course of current medical issues. Questions answered. Outlined signs and symptoms indicating need for more acute intervention. Patient verbalized understanding. After Visit Summary given.         Rennis Harding, PA-C 04/11/20 1418

## 2020-04-12 LAB — NOVEL CORONAVIRUS, NAA: SARS-CoV-2, NAA: NOT DETECTED

## 2020-04-12 LAB — SARS-COV-2, NAA 2 DAY TAT

## 2020-04-14 LAB — CULTURE, GROUP A STREP (THRC)

## 2020-04-19 ENCOUNTER — Encounter (INDEPENDENT_AMBULATORY_CARE_PROVIDER_SITE_OTHER): Payer: Self-pay | Admitting: Nurse Practitioner

## 2020-04-19 ENCOUNTER — Ambulatory Visit (INDEPENDENT_AMBULATORY_CARE_PROVIDER_SITE_OTHER): Payer: 59 | Admitting: Nurse Practitioner

## 2020-04-19 ENCOUNTER — Other Ambulatory Visit: Payer: Self-pay

## 2020-04-19 VITALS — BP 115/75 | HR 102 | Temp 97.7°F | Ht 61.0 in | Wt 190.0 lb

## 2020-04-19 DIAGNOSIS — J069 Acute upper respiratory infection, unspecified: Secondary | ICD-10-CM | POA: Diagnosis not present

## 2020-04-19 DIAGNOSIS — J0100 Acute maxillary sinusitis, unspecified: Secondary | ICD-10-CM

## 2020-04-19 MED ORDER — GUAIFENESIN ER 600 MG PO TB12: 600.0000 mg | ORAL_TABLET | Freq: Two times a day (BID) | ORAL | 0 refills | Status: DC

## 2020-04-19 MED ORDER — LEVOFLOXACIN 750 MG PO TABS
750.0000 mg | ORAL_TABLET | Freq: Every day | ORAL | 0 refills | Status: DC
Start: 2020-04-19 — End: 2020-05-18

## 2020-04-19 NOTE — Progress Notes (Signed)
Subjective:  Patient ID: Victoria Key, female    DOB: [birth year]  Age: 19 y.o. MRN: [id number]  CC:  Chief Complaint  Patient presents with  . Ear Pain    bilateral  . Sore Throat  . Cough  . Sinus Problem      HPI  This patient arrives today for an acute visit for the above.  She has been experiencing cough, sore throat, headache, bilateral ear pain for the last 8 days.  She tells me that day of symptom onset she was seen in urgent care and they tested her for strep and Covid both of which were negative.  She denies any fevers.  She tells me she will sometimes have sputum production with her cough and it is either clear or green usually.  She tells me since her symptoms have persisted over the last week she is coming back to be evaluated.   Past Medical History:  Diagnosis Date  . Abdominal pain, recurrent   . Asthma    since a baby,ususally with anxiety   . Bilateral pes planus   . Depression    Diagnosed since 9th grade but feels like she has had it since 6th grade  . Diarrhea   . Headache(784.0)   . Hypermobility syndrome    Diagnosed by [facility]   . Migraine   . Patellofemoral arthralgia of left knee   . Scoliosis   . Urinary tract infection       Family History  Problem Relation Age of Onset  . Irritable bowel syndrome Brother   . Asthma Brother   . Ulcers Maternal Grandmother   . Depression Maternal Grandmother   . Cancer Maternal Grandmother   . Hyperlipidemia Maternal Grandmother   . Fibromyalgia Maternal Grandmother   . Ulcers Paternal Grandfather   . Hypertension Paternal Grandfather   . Migraines Mother   . Bipolar disorder Mother   . Depression Mother   . Anxiety disorder Mother   . Fibromyalgia Mother   . Alcohol abuse Maternal Grandfather   . Depression Other   . 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           Social History   Tobacco Use  . Smoking status: Former Smoker    Types: Cigarettes, E-cigarettes    Quit date: 07/17/2019    Years since quitting: 0.7  . Smokeless tobacco: Never Used  . Tobacco comment: mother vapes  Substance Use Topics  . Alcohol use: Yes    Comment: once a month     Current Meds  Medication Sig  . ascorbic acid (VITAMIN C) 100 MG tablet Take by mouth.  . carbamazepine (TEGRETOL) 200 MG tablet Take 1 tablet (200 mg total) by mouth 2 (two) times daily.  . clonazePAM (KLONOPIN) 0.5 MG tablet Take 1 tablet (0.5 mg total) by mouth daily as needed for anxiety.  Marland Kitchen FLUoxetine (PROZAC) 20 MG capsule Take 1 capsule (20 mg total) by mouth daily.  Marland Kitchen ibuprofen (ADVIL) 600 MG tablet Take 1 tablet (600 mg total) by mouth 3 (three) times daily.  Marland Kitchen loratadine (CLARITIN) 10 MG tablet Take 10 mg by mouth daily.   . melatonin 5 MG TABS Take 5 mg by mouth at bedtime.  Marland Kitchen nystatin cream (MYCOSTATIN) Apply 1 application topically 2 (two) times daily.  . polyethylene glycol powder (GLYCOLAX/MIRALAX) powder Take  17 g by mouth daily.  . progesterone (PROMETRIUM) 200 MG capsule Take 1 capsule (200 mg total) by mouth daily.  Marland Kitchen thyroid (NP THYROID) 30 MG tablet Take 1 tablet (30 mg total) by mouth daily before breakfast.    ROS:  Review of Systems  Constitutional: Negative for fever.  HENT: Positive for ear pain.   Respiratory: Positive for cough, sputum production and shortness of breath.   Cardiovascular: Positive for chest pain (with breathing).     Objective:   Today's Vitals: BP 115/75 (BP Location: Left Arm, Patient Position: Sitting, Cuff Size: Normal)   Pulse (!) 102   Temp 97.7 F (36.5 C) (Temporal)   Ht 5\' 1"  (1.549 m)   Wt 190 lb (86.2 kg)   SpO2 99%   BMI 35.90 kg/m  Vitals with BMI 04/19/2020 04/11/2020 03/27/2020  Height 5\' 1"  5\' 1"  5\' 1"   Weight 190 lbs 190 lbs 190 lbs 10 oz  BMI 35.92 35.92 36.03  Systolic 115 122 03/29/2020  Diastolic 75 79 75  Pulse 102  114  Some encounter information is confidential and restricted. Go to Review Flowsheets activity to see all data.     Physical Exam Vitals reviewed.  Constitutional:      General: She is not in acute distress.    Appearance: Normal appearance.  HENT:     Head: Normocephalic and atraumatic.     Comments: (+) Frontal and maxillary sinus tenderness bilaterally    Right Ear: Hearing, tympanic membrane and ear canal normal.     Left Ear: Hearing, tympanic membrane and ear canal normal.  Neck:     Vascular: No carotid bruit.  Cardiovascular:     Rate and Rhythm: Normal rate and regular rhythm.     Pulses: Normal pulses.     Heart sounds: Normal heart sounds.  Pulmonary:     Effort: Pulmonary effort is normal.     Breath sounds: Normal breath sounds.  Lymphadenopathy:     Cervical: No cervical adenopathy.     Upper Body:     Right upper body: No supraclavicular adenopathy.     Left upper body: No supraclavicular adenopathy.  Skin:    General: Skin is warm and dry.  Neurological:     General: No focal deficit present.     Mental Status: She is alert and oriented to person, place, and time.  Psychiatric:        Mood and Affect: Mood normal.        Behavior: Behavior normal.        Judgment: Judgment normal.       Strep Test: negative Flu A/B: negative    Assessment and Plan   1. Viral upper respiratory tract infection   2. Acute non-recurrent maxillary sinusitis      Plan: I believe she is most likely experiencing viral URI and viral sinusitis.  No signs of ear infection on exam noted.  Strep and flu test negative.  I recommended she treat symptomatically with use of hydration and Tylenol as needed for pain or fever.  She was also told she can use ibuprofen as needed for pain.  I told her if her symptoms persist into early next week she can take an antibiotic and I will prescribe levofloxacin due to her allergy to Augmentin.  I did warn her of black box warning for  tendon rupture if she were to experience any pain in her joints that she should stop the medicine and let  know.  She tells me she understands.  She is encouraged to call the office if she feels she needs to be seen sooner.    Tests ordered No orders of the defined types were placed in this encounter.     No orders of the defined types were placed in this encounter.   Patient to follow-up later this month as scheduled, or sooner as needed.  Elenore Paddy, NP

## 2020-04-23 ENCOUNTER — Other Ambulatory Visit (INDEPENDENT_AMBULATORY_CARE_PROVIDER_SITE_OTHER): Payer: Self-pay | Admitting: Internal Medicine

## 2020-05-10 ENCOUNTER — Ambulatory Visit (INDEPENDENT_AMBULATORY_CARE_PROVIDER_SITE_OTHER): Payer: 59 | Admitting: Internal Medicine

## 2020-05-16 ENCOUNTER — Telehealth: Payer: Self-pay | Admitting: Adult Health

## 2020-05-16 NOTE — Telephone Encounter (Signed)
Pt believes she has an ovarian cyst due to having a discharge from her belly button. Pt has an appt Friday. I advised to keep that appt and discuss then. Pt voiced understanding. JSY

## 2020-05-16 NOTE — Telephone Encounter (Signed)
Patient believes she has an ovarian cyst because she has mucus coming out of her belly button and is very concern.

## 2020-05-18 ENCOUNTER — Other Ambulatory Visit: Payer: Self-pay

## 2020-05-18 ENCOUNTER — Encounter: Payer: Self-pay | Admitting: Adult Health

## 2020-05-18 ENCOUNTER — Ambulatory Visit (INDEPENDENT_AMBULATORY_CARE_PROVIDER_SITE_OTHER): Payer: 59 | Admitting: Adult Health

## 2020-05-18 VITALS — BP 112/78 | HR 83 | Ht 61.5 in | Wt 188.0 lb

## 2020-05-18 DIAGNOSIS — R1031 Right lower quadrant pain: Secondary | ICD-10-CM | POA: Insufficient documentation

## 2020-05-18 DIAGNOSIS — Z3202 Encounter for pregnancy test, result negative: Secondary | ICD-10-CM | POA: Diagnosis not present

## 2020-05-18 DIAGNOSIS — Z30011 Encounter for initial prescription of contraceptive pills: Secondary | ICD-10-CM | POA: Diagnosis not present

## 2020-05-18 LAB — POCT URINE PREGNANCY: Preg Test, Ur: NEGATIVE

## 2020-05-18 MED ORDER — LO LOESTRIN FE 1 MG-10 MCG / 10 MCG PO TABS
1.0000 | ORAL_TABLET | Freq: Every day | ORAL | 11 refills | Status: DC
Start: 2020-05-18 — End: 2020-09-03

## 2020-05-18 NOTE — Progress Notes (Signed)
  Subjective:     Patient ID: Victoria Key, female   DOB: [birth year], 19 y.o.   MRN: [id number]  HPI Victoria Key is a 19 year old white female,single,G0P0 in complaining of pain in right side for about 3 days, and wonders if ovarian cyst, as mom has them, and she wants to get on OCs. PCP is Dr Karilyn Cota.   Review of Systems Has pain right side for about 3 days Has headaches with periods, on Prometrium from Dr Karilyn Cota  No sex in 3 months  Reviewed past medical,surgical, social and family history. Reviewed medications and allergies.      Objective:   Physical Exam BP 112/78 (BP Location: Left Arm, Patient Position: Sitting, Cuff Size: Normal)   Pulse 83   Ht 5' 1.5" (1.562 m)   Wt 188 lb (85.3 kg)   LMP 05/05/2020   BMI 34.95 kg/m UPT is negative. Skin warm and dry.Pelvic: external genitalia is normal in appearance no lesions, vagina: pink with good moisture,urethra has no lesions or masses noted, cervix:smooth, no CMT,uterus: normal size, shape and contour, non tender, no masses felt, adnexa: no masses,mild RLQ tenderness noted,no rebound. Bladder is non tender and no masses felt.  Examination chaperoned by Malachy Mood LPN Fall risk is low   Upstream - 05/18/20 1027      Pregnancy Intention Screening   Does the patient want to become pregnant in the next year? No    Does the patient's partner want to become pregnant in the next year? No    Would the patient like to discuss contraceptive options today? Yes      Contraception Wrap Up   Current Method Female Condom    End Method Oral Contraceptive             Assessment:     1. Pregnancy examination or test, negative result  2. Encounter for initial prescription of contraceptive pills Will Rx Lo Loestrin, 3 packs given, start today and use condoms every time on tegretol  Stop Prometrium  Meds ordered this encounter  Medications  . Norethindrone-Ethinyl Estradiol-Fe Biphas (LO LOESTRIN FE) 1 MG-10 MCG / 10 MCG tablet    Sig:  Take 1 tablet by mouth daily. Take 1 daily by mouth    Dispense:  28 tablet    Refill:  11    BIN [id number], PCN [id number], GRP [id number]    Order Specific Question:   Supervising Provider    Answer:   Duane Lope H [[id number]]    3. RLQ abdominal pain Suspect that it is ovulation pain, and OCs will help    Plan:     Follow up in 3 months

## 2020-05-23 ENCOUNTER — Ambulatory Visit: Payer: 59 | Admitting: Adult Health

## 2020-07-10 ENCOUNTER — Ambulatory Visit (INDEPENDENT_AMBULATORY_CARE_PROVIDER_SITE_OTHER): Payer: 59

## 2020-07-10 ENCOUNTER — Other Ambulatory Visit: Payer: Self-pay

## 2020-07-10 DIAGNOSIS — Z111 Encounter for screening for respiratory tuberculosis: Secondary | ICD-10-CM | POA: Diagnosis not present

## 2020-07-10 NOTE — Progress Notes (Signed)
Gave patient the PPD test in right arm.  Patient tolerated the injection well.

## 2020-07-12 ENCOUNTER — Ambulatory Visit (INDEPENDENT_AMBULATORY_CARE_PROVIDER_SITE_OTHER): Payer: 59

## 2020-07-12 ENCOUNTER — Other Ambulatory Visit: Payer: Self-pay

## 2020-07-12 DIAGNOSIS — Z111 Encounter for screening for respiratory tuberculosis: Secondary | ICD-10-CM | POA: Diagnosis not present

## 2020-07-12 LAB — TB SKIN TEST
Induration: 2 mm
TB Skin Test: NEGATIVE

## 2020-07-12 NOTE — Progress Notes (Unsigned)
Read TB test on right arm and skin is normal, no redness or swelling.  Patient tolerated well.

## 2020-08-17 ENCOUNTER — Ambulatory Visit: Payer: 59 | Admitting: Adult Health

## 2020-09-03 ENCOUNTER — Ambulatory Visit (INDEPENDENT_AMBULATORY_CARE_PROVIDER_SITE_OTHER): Payer: 59 | Admitting: Adult Health

## 2020-09-03 ENCOUNTER — Encounter: Payer: Self-pay | Admitting: Adult Health

## 2020-09-03 ENCOUNTER — Other Ambulatory Visit: Payer: Self-pay

## 2020-09-03 VITALS — BP 139/81 | HR 98 | Ht 61.5 in | Wt 187.4 lb

## 2020-09-03 DIAGNOSIS — R109 Unspecified abdominal pain: Secondary | ICD-10-CM | POA: Insufficient documentation

## 2020-09-03 DIAGNOSIS — Z3041 Encounter for surveillance of contraceptive pills: Secondary | ICD-10-CM | POA: Insufficient documentation

## 2020-09-03 MED ORDER — NORETHIN ACE-ETH ESTRAD-FE 1-20 MG-MCG PO TABS
1.0000 | ORAL_TABLET | Freq: Every day | ORAL | 11 refills | Status: DC
Start: 2020-09-03 — End: 2022-07-11

## 2020-09-03 NOTE — Progress Notes (Signed)
  Subjective:     Patient ID: Victoria Key, female   DOB: 2001/08/13, 19 y.o.   MRN: [id number]  HPI Victoria Key  is a 19 year old white female,single, G0P0 back in follow up on starting Lo Loestrin in September for ?ovualtion pain, she says she is better but still has some cramping and had 2 periods last month, but skipped a pill. PCP is Dr Karilyn Cota.  Review of Systems Still has some cramping Has bleeding after skipping pills Nor currently sexually active Reviewed past medical,surgical, social and family history. Reviewed medications and allergies.     Objective:   Physical Exam BP 139/81 (BP Location: Right Arm, Patient Position: Sitting, Cuff Size: Normal)   Pulse 98   Ht 5' 1.5" (1.562 m)   Wt 187 lb 6.4 oz (85 kg)   LMP 08/17/2020 (Exact Date)   BMI 34.84 kg/m  Skin warm and dry.Lungs: clear to ausculation bilaterally. Cardiovascular: regular rate and rhythm.  Upstream - 09/03/20 1650      Pregnancy Intention Screening   Does the patient want to become pregnant in the next year? No    Does the patient's partner want to become pregnant in the next year? No    Would the patient like to discuss contraceptive options today? No      Contraception Wrap Up   Current Method Oral Contraceptive    End Method Oral Contraceptive    Contraception Counseling Provided No             Assessment:     1. Encounter for surveillance of contraceptive pills Finish this pack of lo Loestrin and then start junel 1-20,if hs sex use condoms  Meds ordered this encounter  Medications  . norethindrone-ethinyl estradiol (LOESTRIN FE) 1-20 MG-MCG tablet    Sig: Take 1 tablet by mouth daily.    Dispense:  28 tablet    Refill:  11    Order Specific Question:   Supervising Provider    Answer:   Duane Lope H [[id number]]   2. Abdominal cramping     Plan:     Follow up in 3 months

## 2020-10-10 ENCOUNTER — Other Ambulatory Visit (INDEPENDENT_AMBULATORY_CARE_PROVIDER_SITE_OTHER): Payer: Self-pay | Admitting: Internal Medicine

## 2020-10-10 ENCOUNTER — Other Ambulatory Visit (HOSPITAL_COMMUNITY): Payer: Self-pay | Admitting: Psychiatry

## 2020-10-11 NOTE — Telephone Encounter (Signed)
Call for appt

## 2020-10-15 ENCOUNTER — Encounter (HOSPITAL_COMMUNITY): Payer: Self-pay | Admitting: Psychiatry

## 2020-10-15 ENCOUNTER — Telehealth (INDEPENDENT_AMBULATORY_CARE_PROVIDER_SITE_OTHER): Payer: BC Managed Care – PPO | Admitting: Psychiatry

## 2020-10-15 ENCOUNTER — Other Ambulatory Visit: Payer: Self-pay

## 2020-10-15 DIAGNOSIS — F321 Major depressive disorder, single episode, moderate: Secondary | ICD-10-CM | POA: Diagnosis not present

## 2020-10-15 MED ORDER — CARBAMAZEPINE 200 MG PO TABS: 600.0000 mg | ORAL_TABLET | Freq: Three times a day (TID) | ORAL | 2 refills | Status: DC

## 2020-10-15 MED ORDER — CLONAZEPAM 0.5 MG PO TABS: 0.5000 mg | ORAL_TABLET | Freq: Every day | ORAL | 2 refills | Status: DC | PRN

## 2020-10-15 MED ORDER — FLUOXETINE HCL 20 MG PO CAPS: 20.0000 mg | ORAL_CAPSULE | Freq: Every day | ORAL | 3 refills | Status: DC

## 2020-10-15 NOTE — Progress Notes (Signed)
Virtual Visit via Telephone Note  I connected with BRISHA MCCABE on 10/15/20 at  1:00 PM EST by telephone and verified that I am speaking with the correct person using two identifiers.  Location: Patient: home Provider: home   I discussed the limitations, risks, security and privacy concerns of performing an evaluation and management service by telephone and the availability of in person appointments. I also discussed with the patient that there may be a patient responsible charge related to this service. The patient expressed understanding and agreed to proceed.    I discussed the assessment and treatment plan with the patient. The patient was provided an opportunity to ask questions and all were answered. The patient agreed with the plan and demonstrated an understanding of the instructions.   The patient was advised to call back or seek an in-person evaluation if the symptoms worsen or if the condition fails to improve as anticipated.  I provided 15 minutes of non-face-to-face time during this encounter.   Diannia Ruder, MD  [facility] MD/PA/NP OP Progress Note  10/15/2020 1:16 PM AYELET GRUENEWALD  MRN:  [id number]  Chief Complaint:  Chief Complaint    Depression; Anxiety; Follow-up     HPI: This patient is a 20 year old female who lives with her mother and stepfather in [place].  She is studying online education classes [employer].  She is also working in a daycare center.  The patient returns after long absence.  She was last seen 10 months ago.  She denies being depressed but states that her mood swings and irritability are much worse.  Is visit with any specific person.  She states that people "get under my skin easily."  She has been on Tegretol 400 mg for a long time and it worked quite well for mood swings and irritability but does not seem to be working as well now.  I suggested we increase it.  I noted that she is on oral contraceptives and mention that Tegretol  can reduce their effectiveness and that she should use an alternative form of birth control with them and she voices understanding.  She is sleeping well without the trazodone and feels like the Prozac continues to help her mood in terms of depression.  She still uses the clonazepam very occasionally for anxiety. Visit Diagnosis:    ICD-10-CM   1. Major depressive disorder, single episode, moderate with anxious distress (HCC)  F32.1     Past Psychiatric History: Prior outpatient treatment in our office  Past Medical History:  Past Medical History:  Diagnosis Date  . Abdominal pain, recurrent   . Asthma    since a baby,ususally with anxiety   . Bilateral pes planus   . Depression    Diagnosed since 9th grade but feels like she has had it since 6th grade  . Diarrhea   . Headache(784.0)   . Hypermobility syndrome    Diagnosed by [facility]   . Migraine   . Patellofemoral arthralgia of left knee   . Scoliosis   . Urinary tract infection     Past Surgical History:  Procedure Laterality Date  . KNEE ARTHROSCOPY WITH MEDIAL PATELLAR FEMORAL LIGAMENT RECONSTRUCTION Left 07/26/2018   Procedure: LEFT KNEE ARTHROSCOPY WITH MEDIAL PATELLAR FEMORAL LIGAMENT RECONSTRUCTION;  Surgeon: Bjorn Pippin, MD;  Location: [facility];  Service: Orthopedics;  Laterality: Left;  . LABIOPLASTY Left 04/20/2018   Procedure: LEFT LABIAL REDUCTION;  Surgeon: [name], MD;  Location: [facility];  Service: Gynecology;  Laterality: Left;  . URETERAL REIMPLANTION  2007   left-sided    Family Psychiatric History: see below  Family History:  Family History  Problem Relation Age of Onset  . Irritable bowel syndrome Brother   . Asthma Brother   . Ulcers Maternal Grandmother   . Depression Maternal Grandmother   . Cancer Maternal Grandmother   . Hyperlipidemia Maternal Grandmother   . Fibromyalgia Maternal Grandmother   . Ulcers Paternal Grandfather   . Hypertension Paternal  Grandfather   . Migraines Mother   . Bipolar disorder Mother   . Depression Mother   . Anxiety disorder Mother   . Fibromyalgia Mother   . Alcohol abuse Maternal Grandfather   . Depression Other   . Depression Maternal Aunt     Social History:  Social History   Socioeconomic History  . Marital status: Single    Spouse name: Not on file  . Number of children: Not on file  . Years of education: Not on file  . Highest education level: Not on file  Occupational History  . Not on file  Tobacco Use  . Smoking status: Former Smoker    Types: Cigarettes, E-cigarettes    Quit date: 07/17/2019    Years since quitting: 1.2  . Smokeless tobacco: Never Used  . Tobacco comment: mother vapes  Vaping Use  . Vaping Use: Every day  . Start date: 09/15/2017  Substance and Sexual Activity  . Alcohol use: Yes    Comment: once a month  . Drug use: No  . Sexual activity: Not Currently    Birth control/protection: Condom, Pill  Other Topics Concern  . 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         Social Determinants of Health   Financial Resource Strain: Not on file  Food Insecurity: Not on file  Transportation Needs: Not on file  Physical Activity: Not on file  Stress: Not on file  Social Connections: Not on file    Allergies:  Allergies  Allergen Reactions  . Augmentin [Amoxicillin-Pot Clavulanate] Other (See Comments)    Possible serum like sickness vs angioedema  Has patient had a PCN reaction causing immediate rash, facial/tongue/throat swelling, SOB or lightheadedness with hypotension: Yes Has patient had a PCN reaction causing severe rash involving mucus membranes or skin necrosis: No Has patient had a PCN reaction that required hospitalization: Yes Has patient had a PCN reaction occurring within the last 10 years: Yes If all of the above answers are "NO", then may proceed with Cephalosporin  use.   . Adhesive [Tape] Rash  . Latex Rash    Metabolic Disorder Labs: Lab Results  Component Value Date   HGBA1C 5.2 02/14/2020   MPG 103 02/14/2020   No results found for: PROLACTIN Lab Results  Component Value Date   CHOL 209 (H) 02/14/2020   TRIG 224 (H) 02/14/2020   HDL 44 (L) 02/14/2020   CHOLHDL 4.8 02/14/2020   LDLCALC 129 (H) 02/14/2020   Lab Results  Component Value Date   TSH 2.55 02/14/2020   TSH 1.800 06/29/2019    Therapeutic Level Labs: No results found for: LITHIUM No results found for: VALPROATE No components found for:  CBMZ  Current Medications: Current Outpatient Medications  Medication Sig Dispense Refill  . ascorbic acid (VITAMIN C) 100 MG tablet Take by mouth.    . carbamazepine (TEGRETOL) 200 MG tablet Take  3 tablets (600 mg total) by mouth 3 (three) times daily. 270 tablet 2  . Cholecalciferol (VITAMIN D3 PO) Take 10,000 Units by mouth at bedtime.    . clonazePAM (KLONOPIN) 0.5 MG tablet Take 1 tablet (0.5 mg total) by mouth daily as needed for anxiety. 30 tablet 2  . FLUoxetine (PROZAC) 20 MG capsule Take 1 capsule (20 mg total) by mouth daily. 90 capsule 3  . ibuprofen (ADVIL) 600 MG tablet Take 1 tablet (600 mg total) by mouth 3 (three) times daily. (Patient taking differently: Take 600 mg by mouth.) 60 tablet 3  . melatonin 5 MG TABS Take 5 mg by mouth at bedtime.    . norethindrone-ethinyl estradiol (LOESTRIN FE) 1-20 MG-MCG tablet Take 1 tablet by mouth daily. 28 tablet 11  . nystatin cream (MYCOSTATIN) Apply 1 application topically 2 (two) times daily. (Patient taking differently: Apply 1 application topically.) 30 g 0  . polyethylene glycol powder (GLYCOLAX/MIRALAX) powder Take 17 g by mouth daily. (Patient taking differently: Take 17 g by mouth.) 3350 g 1  . thyroid (NP THYROID) 30 MG tablet Take 1 tablet (30 mg total) by mouth daily before breakfast. 30 tablet 3   No current facility-administered medications for this visit.      Musculoskeletal: Strength & Muscle Tone: within normal limits Gait & Station: normal Patient leans: N/A  Psychiatric Specialty Exam: Review of Systems  All other systems reviewed and are negative.   There were no vitals taken for this visit.There is no height or weight on file to calculate BMI.  General Appearance: NA  Eye Contact:  NA  Speech:  Clear and Coherent  Volume:  Normal  Mood:  Irritable  Affect:  NA  Thought Process:  Goal Directed  Orientation:  Full (Time, Place, and Person)  Thought Content: Rumination   Suicidal Thoughts:  No  Homicidal Thoughts:  No  Memory:  Immediate;   Good Recent;   Good Remote;   Good  Judgement:  Good  Insight:  Fair  Psychomotor Activity:  Normal  Concentration:  Concentration: Good and Attention Span: Good  Recall:  Good  Fund of Knowledge: Good  Language: Good  Akathisia:  No  Handed:  Right  AIMS (if indicated): not done  Assets:  Communication Skills Desire for Improvement Physical Health Resilience Social Support Talents/Skills  ADL's:  Intact  Cognition: WNL  Sleep:  Good   Screenings: GAD-7   Flowsheet Row Integrated Behavioral Health from 08/29/2019 in [facility]  Total GAD-7 Score 13    PHQ2-9   Flowsheet Row Office Visit from 02/28/2020 in [facility] Office Visit from 11/24/2019 in [facility] Integrated Behavioral Health from 08/29/2019 in [facility]  PHQ-2 Total Score 3 0 3  PHQ-9 Total Score 19 - 14       Assessment and Plan: This patient is a 20 year old female with a history of anxiety depression and mood swings.  She seems more irritable and agitated lately.  We will therefore increase Tegretol to 600 mg daily for mood stabilization.  She is warned about the interaction with oral contraceptives.  She will continue Prozac 20 mg daily for depression and clonazepam 0.5 mg daily as needed for anxiety.  She will return to see me in 4 weeks   Diannia Ruder,  MD 10/15/2020, 1:16 PM

## 2020-10-27 ENCOUNTER — Other Ambulatory Visit (INDEPENDENT_AMBULATORY_CARE_PROVIDER_SITE_OTHER): Payer: Self-pay | Admitting: Internal Medicine

## 2020-10-28 ENCOUNTER — Other Ambulatory Visit (INDEPENDENT_AMBULATORY_CARE_PROVIDER_SITE_OTHER): Payer: Self-pay | Admitting: Internal Medicine

## 2020-11-12 ENCOUNTER — Telehealth (INDEPENDENT_AMBULATORY_CARE_PROVIDER_SITE_OTHER): Payer: BC Managed Care – PPO | Admitting: Psychiatry

## 2020-11-12 ENCOUNTER — Other Ambulatory Visit: Payer: Self-pay

## 2020-11-12 ENCOUNTER — Encounter (HOSPITAL_COMMUNITY): Payer: Self-pay

## 2020-11-12 ENCOUNTER — Encounter (HOSPITAL_COMMUNITY): Payer: Self-pay | Admitting: Psychiatry

## 2020-11-12 DIAGNOSIS — F321 Major depressive disorder, single episode, moderate: Secondary | ICD-10-CM | POA: Diagnosis not present

## 2020-11-12 MED ORDER — CLONAZEPAM 0.5 MG PO TABS: 0.5000 mg | ORAL_TABLET | Freq: Every day | ORAL | 2 refills | Status: DC | PRN

## 2020-11-12 MED ORDER — CARBAMAZEPINE 200 MG PO TABS: 600.0000 mg | ORAL_TABLET | Freq: Three times a day (TID) | ORAL | 2 refills | Status: DC

## 2020-11-12 MED ORDER — FLUOXETINE HCL 20 MG PO CAPS: 20.0000 mg | ORAL_CAPSULE | Freq: Every day | ORAL | 3 refills | Status: DC

## 2020-11-12 NOTE — Progress Notes (Signed)
Virtual Visit via Telephone Note  I connected with Victoria Key on 11/12/20 at  1:00 PM EST by telephone and verified that I am speaking with the correct person using two identifiers.  Location: Patient: home Provider: home   I discussed the limitations, risks, security and privacy concerns of performing an evaluation and management service by telephone and the availability of in person appointments. I also discussed with the patient that there may be a patient responsible charge related to this service. The patient expressed understanding and agreed to proceed.    I discussed the assessment and treatment plan with the patient. The patient was provided an opportunity to ask questions and all were answered. The patient agreed with the plan and demonstrated an understanding of the instructions.   The patient was advised to call back or seek an in-person evaluation if the symptoms worsen or if the condition fails to improve as anticipated.  I provided 15 minutes of non-face-to-face time during this encounter.   Diannia Ruder, MD  [facility] MD/PA/NP OP Progress Note  11/12/2020 1:19 PM Victoria Key  MRN:  [id number]  Chief Complaint:  Chief Complaint    Anxiety; Depression; Follow-up     HPI: This patient is a 20 year old female who lives with her mother and stepfather in [place]. She is studying online education classes [employer].  She is also working in a daycare center.  The patient returns after 4 weeks.  Last time she felt more angry and irritable and I did increase her Tegretol to 600 mg.  She states that she is doing somewhat better.  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  This is just been too much for her.  She is trying to find other employment.  She is generally sleeping well feels her mood is stable denies thoughts of suicide or self-harm.  She is doing  well in school and getting along well with her family and boyfriend.     Visit Diagnosis:    ICD-10-CM   1. Major depressive disorder, single episode, moderate with anxious distress (HCC)  F32.1     Past Psychiatric History: Prior outpatient treatment in our office  Past Medical History:  Past Medical History:  Diagnosis Date  . Abdominal pain, recurrent   . Asthma    since a baby,ususally with anxiety   . Bilateral pes planus   . Depression    Diagnosed since 9th grade but feels like she has had it since 6th grade  . Diarrhea   . Headache(784.0)   . Hypermobility syndrome    Diagnosed by [facility] Rheumat[facility]   . Migraine   . Patellofemoral arthralgia of left knee   . Scoliosis   . Urinary tract infection     Past Surgical History:  Procedure Laterality Date  . KNEE ARTHROSCOPY WITH MEDIAL PATELLAR FEMORAL LIGAMENT RECONSTRUCTION Left 07/26/2018   Procedure: LEFT KNEE ARTHROSCOPY WITH MEDIAL PATELLAR FEMORAL LIGAMENT RECONSTRUCTION;  Surgeon: Bjorn Pippin, MD;  Location: [facility];  Service: Orthopedics;  Laterality: Left;  . LABIOPLASTY Left 04/20/2018   Procedure: LEFT LABIAL REDUCTION;  Surgeon: [name], MD;  Location: [facility];  Service: Gynecology;  Laterality: Left;  . URETERAL REIMPLANTION  2007   left-sided    Family Psychiatric History: see below stop it  Family History:  Family History  Problem Relation Age of Onset  . Irritable bowel syndrome Brother   . Asthma Brother   . Ulcers Maternal Grandmother   . Depression Maternal Grandmother   . Cancer Maternal Grandmother   . Hyperlipidemia Maternal Grandmother   . Fibromyalgia Maternal Grandmother   . Ulcers Paternal Grandfather   . Hypertension Paternal Grandfather   . Migraines Mother   . Bipolar disorder Mother   . Depression Mother   . Anxiety disorder Mother   . Fibromyalgia Mother   . Alcohol abuse Maternal Grandfather   . Depression Other   . Depression Maternal Aunt      Social History:  Social History   Socioeconomic History  . Marital status: Single    Spouse name: Not on file  . Number of children: Not on file  . Years of education: Not on file  . Highest education level: Not on file  Occupational History  . Not on file  Tobacco Use  . Smoking status: Former Smoker    Types: Cigarettes, E-cigarettes    Quit date: 07/17/2019    Years since quitting: 1.3  . Smokeless tobacco: Never Used  . Tobacco comment: mother vapes  Vaping Use  . Vaping Use: Every day  . Start date: 09/15/2017  Substance and Sexual Activity  . Alcohol use: Yes    Comment: once a month  . Drug use: No  . Sexual activity: Not Currently    Birth control/protection: Condom, Pill  Other Topics Concern  . 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         Social Determinants of Health   Financial Resource Strain: Not on file  Food Insecurity: Not on file  Transportation Needs: Not on file  Physical Activity: Not on file  Stress: Not on file  Social Connections: Not on file    Allergies:  Allergies  Allergen Reactions  . Augmentin [Amoxicillin-Pot Clavulanate] Other (See Comments)    Possible serum like sickness vs angioedema  Has patient had a PCN reaction causing immediate rash, facial/tongue/throat swelling, SOB or lightheadedness with hypotension: Yes Has patient had a PCN reaction causing severe rash involving mucus membranes or skin necrosis: No Has patient had a PCN reaction that required hospitalization: Yes Has patient had a PCN reaction occurring within the last 10 years: Yes If all of the above answers are "NO", then may proceed with Cephalosporin use.   . Adhesive [Tape] Rash  . Latex Rash    Metabolic Disorder Labs: Lab Results  Component Value Date   HGBA1C 5.2 02/14/2020   MPG 103 02/14/2020   No results found for: PROLACTIN Lab Results  Component Value Date    CHOL 209 (H) 02/14/2020   TRIG 224 (H) 02/14/2020   HDL 44 (L) 02/14/2020   CHOLHDL 4.8 02/14/2020   LDLCALC 129 (H) 02/14/2020   Lab Results  Component Value Date   TSH 2.55 02/14/2020   TSH 1.800 06/29/2019    Therapeutic Level Labs: No results found for: LITHIUM No results found for: VALPROATE No components found for:  CBMZ  Current Medications: Current Outpatient Medications  Medication Sig Dispense Refill  . ascorbic acid (VITAMIN C) 100 MG tablet Take by mouth.    . carbamazepine (TEGRETOL) 200 MG tablet Take 3 tablets (600 mg total) by mouth 3 (three) times daily. 270 tablet 2  . Cholecalciferol (VITAMIN D3 PO) Take 10,000 Units by mouth at bedtime.    Marland Kitchen  clonazePAM (KLONOPIN) 0.5 MG tablet Take 1 tablet (0.5 mg total) by mouth daily as needed for anxiety. 30 tablet 2  . FLUoxetine (PROZAC) 20 MG capsule Take 1 capsule (20 mg total) by mouth daily. 90 capsule 3  . ibuprofen (ADVIL) 600 MG tablet Take 1 tablet (600 mg total) by mouth 3 (three) times daily. (Patient taking differently: Take 600 mg by mouth.) 60 tablet 3  . melatonin 5 MG TABS Take 5 mg by mouth at bedtime.    . norethindrone-ethinyl estradiol (LOESTRIN FE) 1-20 MG-MCG tablet Take 1 tablet by mouth daily. 28 tablet 11  . nystatin cream (MYCOSTATIN) Apply 1 application topically 2 (two) times daily. (Patient taking differently: Apply 1 application topically.) 30 g 0  . polyethylene glycol powder (GLYCOLAX/MIRALAX) powder Take 17 g by mouth daily. (Patient taking differently: Take 17 g by mouth.) 3350 g 1  . thyroid (NP THYROID) 30 MG tablet Take 1 tablet (30 mg total) by mouth daily before breakfast. 30 tablet 3   No current facility-administered medications for this visit.     Musculoskeletal: Strength & Muscle Tone: within normal limits Gait & Station: normal Patient leans: N/A  Psychiatric Specialty Exam: Review of Systems  All other systems reviewed and are negative.   There were no vitals taken  for this visit.There is no height or weight on file to calculate BMI.  General Appearance: NA  Eye Contact:  NA  Speech:  Clear and Coherent  Volume:  Normal  Mood:  Euthymic  Affect:  NA  Thought Process:  Goal Directed  Orientation:  Full (Time, Place, and Person)  Thought Content: WDL   Suicidal Thoughts:  No  Homicidal Thoughts:  No  Memory:  Immediate;   Good Recent;   Good Remote;   Fair  Judgement:  Good  Insight:  Good  Psychomotor Activity:  Normal  Concentration:  Concentration: Good and Attention Span: Good  Recall:  Good  Fund of Knowledge: Good  Language: Good  Akathisia:  No  Handed:  Right  AIMS (if indicated): not done  Assets:  Communication Skills Desire for Improvement Physical Health Resilience Social Support Talents/Skills  ADL's:  Intact  Cognition: WNL  Sleep:  Good   Screenings: GAD-7   Flowsheet Row Integrated Behavioral Health from 08/29/2019 in [facility]  Total GAD-7 Score 13    PHQ2-9   Flowsheet Row Office Visit from 02/28/2020 in [facility] Office Visit from 11/24/2019 in [facility] Integrated Behavioral Health from 08/29/2019 in [facility]  PHQ-2 Total Score 3 0 3  PHQ-9 Total Score 19 -- 14       Assessment and Plan: This patient is a 20 year old female with a history of anxiety depression and mood swings.  She seems to be doing better on the higher dosage of Tegretol.  She will remain at 600 mg daily for mood stabilization.  She will continue Prozac 20 mg daily for depression and clonazepam 0.5 mg daily as needed for anxiety.  She will return to see me in 3 months   Diannia Ruder, MD 11/12/2020, 1:19 PM

## 2020-12-01 ENCOUNTER — Other Ambulatory Visit (HOSPITAL_COMMUNITY): Payer: Self-pay | Admitting: Psychiatry

## 2020-12-03 ENCOUNTER — Telehealth (HOSPITAL_COMMUNITY): Payer: Self-pay

## 2020-12-03 ENCOUNTER — Ambulatory Visit: Payer: 59 | Admitting: Adult Health

## 2020-12-03 NOTE — Telephone Encounter (Signed)
Medication management - Telephone message left for patient, per request of Dr. Tenny Craw, to verify pt is taking Tegretol 200 mg, 3 at bedtime only and not 3 times a day.  Requested pt call our office back to verify she how she is taking her Tegretol and that she agrees with instructions left.  Will attempt to reach patient again later today.

## 2021-03-25 ENCOUNTER — Encounter: Payer: Self-pay | Admitting: Pediatrics

## 2021-04-17 DIAGNOSIS — M25571 Pain in right ankle and joints of right foot: Secondary | ICD-10-CM | POA: Diagnosis not present

## 2021-04-24 ENCOUNTER — Other Ambulatory Visit: Payer: Self-pay

## 2021-04-24 ENCOUNTER — Ambulatory Visit
Admission: EM | Admit: 2021-04-24 | Discharge: 2021-04-24 | Disposition: A | Discharge status: Home / Self Care | Payer: BC Managed Care – PPO | Attending: Family Medicine | Admitting: Family Medicine

## 2021-04-24 DIAGNOSIS — K529 Noninfective gastroenteritis and colitis, unspecified: Secondary | ICD-10-CM | POA: Diagnosis not present

## 2021-04-24 MED ORDER — ONDANSETRON 4 MG PO TBDP
4.0000 mg | ORAL_TABLET | Freq: Three times a day (TID) | ORAL | 0 refills | Status: DC | PRN
Start: 2021-04-24 — End: 2022-03-21

## 2021-04-24 NOTE — ED Provider Notes (Signed)
[facility]   [id number] 04/24/21 Arrival Time: 1246  ASSESSMENT & PLAN:  1. Gastroenteritis    No signs of dehydration requiring IVF.  Meds ordered this encounter  Medications   ondansetron (ZOFRAN-ODT) 4 MG disintegrating tablet    Sig: Take 1 tablet (4 mg total) by mouth every 8 (eight) hours as needed for nausea or vomiting.    Dispense:  15 tablet    Refill:  0   Discussed typical duration of symptoms for suspected viral GI illness. Will do her best to ensure adequate fluid intake in order to avoid dehydration. Will proceed to the Emergency Department for evaluation if unable to tolerate PO fluids regularly.  Otherwise she will f/u with her PCP or here if not showing improvement over the next 48-72 hours.  Reviewed expectations re: course of current medical issues. Questions answered. Outlined signs and symptoms indicating need for more acute intervention. Patient verbalized understanding. After Visit Summary given.   SUBJECTIVE: History from: patient.  Victoria Key is a 20 y.o. female who presents with complaint of non-bilious, non-bloody intermittent n/v with non-bloody diarrhea. Onset  approx 3 d ago. Vomiting has mostly resolved; slight nausea . Abdominal discomfort: mild and cramping. Afebrile. PO intake: decreased. Ambulatory without assistance. Urinary symptoms: none.  Patient's last menstrual period was 04/12/2021 (exact date).  Past Surgical History:  Procedure Laterality Date   KNEE ARTHROSCOPY WITH MEDIAL PATELLAR FEMORAL LIGAMENT RECONSTRUCTION Left 07/26/2018   Procedure: LEFT KNEE ARTHROSCOPY WITH MEDIAL PATELLAR FEMORAL LIGAMENT RECONSTRUCTION;  Surgeon: Bjorn Pippin, MD;  Location: [facility];  Service: Orthopedics;  Laterality: Left;   LABIOPLASTY Left 04/20/2018   Procedure: LEFT LABIAL REDUCTION;  Surgeon: [name], MD;  Location: [facility];  Service: Gynecology;  Laterality: Left;   URETERAL REIMPLANTION  2007    left-sided   OBJECTIVE:  Vitals:   04/24/21 1317  BP: 113/74  Pulse: 84  Resp: 18  Temp: 98.7 F (37.1 C)  TempSrc: Oral  SpO2: 98%    General appearance: alert; no distress Lungs: unlabored Heart: regular Skin: warm; dry Neurologic: normal gait Psychological: alert and cooperative; normal mood and affect   Allergies  Allergen Reactions   Penicillins Anaphylaxis and Other (See Comments)    Possible serum like sickness vs angioedema  Has patient had a PCN reaction causing immediate rash, facial/tongue/throat swelling, SOB or lightheadedness with hypotension: Yes Has patient had a PCN reaction causing severe rash involving mucus membranes or skin necrosis: No Has patient had a PCN reaction that required hospitalization: Yes Has patient had a PCN reaction occurring within the last 10 years: Yes If all of the above answers are "NO", then may proceed with Cephalosporin use.   Augmentin [Amoxicillin-Pot Clavulanate] Other (See Comments)    Possible serum like sickness vs angioedema  Has patient had a PCN reaction causing immediate rash, facial/tongue/throat swelling, SOB or lightheadedness with hypotension: Yes Has patient had a PCN reaction causing severe rash involving mucus membranes or skin necrosis: No Has patient had a PCN reaction that required hospitalization: Yes Has patient had a PCN reaction occurring within the last 10 years: Yes If all of the above answers are "NO", then may proceed with Cephalosporin use.    Adhesive [Tape] Rash   Latex Rash  Past Medical History:  Diagnosis Date   Abdominal pain, recurrent    Asthma    since a baby,ususally with anxiety    Bilateral pes planus    Depression    Diagnosed since 9th grade but feels like she has had it since 6th grade   Diarrhea    Headache(784.0)    Hypermobility syndrome    Diagnosed by [facility]    Migraine    Patellofemoral arthralgia of left knee     Scoliosis    Urinary tract infection    Social History   Socioeconomic History   Marital status: Single    Spouse name: Not on file   Number of children: Not on file   Years of education: Not on file   Highest education level: Not on file  Occupational History   Not on file  Tobacco Use   Smoking status: Former    Types: Cigarettes, E-cigarettes    Quit date: 07/17/2019    Years since quitting: 1.7   Smokeless tobacco: Never   Tobacco comments:    mother vapes  Vaping Use   Vaping Use: Every day   Start date: 09/15/2017  Substance and Sexual Activity   Alcohol use: Yes    Comment: once a month   Drug use: No   Sexual activity: Not Currently    Birth control/protection: Condom  Other Topics Concern   Not on file  Social History Narrative   Lives with mother .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         Social Determinants of Health   Financial Resource Strain: Not on file  Food Insecurity: Not on file  Transportation Needs: Not on file  Physical Activity: Not on file  Stress: Not on file  Social Connections: Not on file  Intimate Partner Violence: Not on file   Family History  Problem Relation Age of Onset   Irritable bowel syndrome Brother    Asthma Brother    Ulcers Maternal Grandmother    Depression Maternal Grandmother    Cancer Maternal Grandmother    Hyperlipidemia Maternal Grandmother    Fibromyalgia Maternal Grandmother    Ulcers Paternal Grandfather    Hypertension Paternal Grandfather    Migraines Mother    Bipolar disorder Mother    Depression Mother    Anxiety disorder Mother    Fibromyalgia Mother    Alcohol abuse Maternal Grandfather    Depression Other    Depression Maternal Renato Gails, MD 04/24/21 [phone]

## 2021-04-24 NOTE — Discharge Instructions (Addendum)
Please do your best to ensure adequate fluid intake in order to avoid dehydration. If you find that you are unable to tolerate drinking fluids regularly please proceed to the Emergency Department for evaluation. ° ° °

## 2021-04-24 NOTE — ED Triage Notes (Signed)
Onset yesterday of emesis x5, diarrhea and bilateral eye pressure. Right eye sxs> left. Pt describes eye sxs are feeling swollen as if something was in them. Has been taking ibuprofen with some relief.

## 2021-07-14 ENCOUNTER — Other Ambulatory Visit: Payer: Self-pay

## 2021-07-14 ENCOUNTER — Encounter: Payer: Self-pay | Admitting: Emergency Medicine

## 2021-07-14 ENCOUNTER — Ambulatory Visit
Admission: EM | Admit: 2021-07-14 | Discharge: 2021-07-14 | Disposition: A | Discharge status: Home / Self Care | Payer: BC Managed Care – PPO | Attending: Urgent Care | Admitting: Urgent Care

## 2021-07-14 DIAGNOSIS — J069 Acute upper respiratory infection, unspecified: Secondary | ICD-10-CM | POA: Diagnosis not present

## 2021-07-14 DIAGNOSIS — J3489 Other specified disorders of nose and nasal sinuses: Secondary | ICD-10-CM | POA: Insufficient documentation

## 2021-07-14 DIAGNOSIS — R070 Pain in throat: Secondary | ICD-10-CM | POA: Insufficient documentation

## 2021-07-14 DIAGNOSIS — Z20822 Contact with and (suspected) exposure to covid-19: Secondary | ICD-10-CM | POA: Insufficient documentation

## 2021-07-14 LAB — POCT RAPID STREP A (OFFICE): Rapid Strep A Screen: NEGATIVE

## 2021-07-14 MED ORDER — PROMETHAZINE-DM 6.25-15 MG/5ML PO SYRP
5.0000 mL | ORAL_SOLUTION | Freq: Every evening | ORAL | 0 refills | Status: DC | PRN
Start: 2021-07-14 — End: 2022-04-09

## 2021-07-14 MED ORDER — BENZONATATE 100 MG PO CAPS: 100.0000 mg | ORAL_CAPSULE | Freq: Three times a day (TID) | ORAL | 0 refills | Status: DC | PRN

## 2021-07-14 MED ORDER — PSEUDOEPHEDRINE HCL 60 MG PO TABS
60.0000 mg | ORAL_TABLET | Freq: Three times a day (TID) | ORAL | 0 refills | Status: DC | PRN
Start: 2021-07-14 — End: 2022-11-27

## 2021-07-14 MED ORDER — IPRATROPIUM BROMIDE 0.03 % NA SOLN
2.0000 | Freq: Two times a day (BID) | NASAL | 0 refills | Status: DC
Start: 2021-07-14 — End: 2022-07-11

## 2021-07-14 MED ORDER — CETIRIZINE HCL 10 MG PO TABS
10.0000 mg | ORAL_TABLET | Freq: Every day | ORAL | 0 refills | Status: DC
Start: 2021-07-14 — End: 2023-10-28

## 2021-07-14 NOTE — ED Triage Notes (Signed)
Sore throat and headache since yesterday, no energy, cough

## 2021-07-14 NOTE — Discharge Instructions (Addendum)

## 2021-07-14 NOTE — ED Provider Notes (Signed)
[facility]   MRN: [id number] DOB: [birth year]  Subjective:   Victoria Key is a 20 y.o. female presenting for 1 day history of acute onset throat pain, sinus headaches, coughing, runny and stuffy nose.  No chest pain, shortness of breath or wheezing.  Patient has a history of strep throat.  Has a history of asthma as well but has not needed an inhaler for a long time.  No current facility-administered medications for this encounter.  Current Outpatient Medications:    ascorbic acid (VITAMIN C) 100 MG tablet, Take by mouth., Disp: , Rfl:    carbamazepine (TEGRETOL) 200 MG tablet, Take 3 tablets (600 mg total) by mouth at bedtime., Disp: 90 tablet, Rfl: 1   Cholecalciferol (VITAMIN D3 PO), Take 10,000 Units by mouth at bedtime., Disp: , Rfl:    clonazePAM (KLONOPIN) 0.5 MG tablet, Take 1 tablet (0.5 mg total) by mouth daily as needed for anxiety., Disp: 30 tablet, Rfl: 2   FLUoxetine (PROZAC) 20 MG capsule, Take 1 capsule (20 mg total) by mouth daily., Disp: 90 capsule, Rfl: 3   ibuprofen (ADVIL) 600 MG tablet, Take 1 tablet (600 mg total) by mouth 3 (three) times daily. (Patient taking differently: Take 600 mg by mouth.), Disp: 60 tablet, Rfl: 3   lamoTRIgine (LAMICTAL) 100 MG tablet, Take by mouth., Disp: , Rfl:    melatonin 5 MG TABS, Take 5 mg by mouth at bedtime., Disp: , Rfl:    norethindrone-ethinyl estradiol (LOESTRIN FE) 1-20 MG-MCG tablet, Take 1 tablet by mouth daily., Disp: 28 tablet, Rfl: 11   nystatin cream (MYCOSTATIN), Apply 1 application topically 2 (two) times daily. (Patient taking differently: Apply 1 application topically.), Disp: 30 g, Rfl: 0   ondansetron (ZOFRAN-ODT) 4 MG disintegrating tablet, Take 1 tablet (4 mg total) by mouth every 8 (eight) hours as needed for nausea or vomiting., Disp: 15 tablet, Rfl: 0   polyethylene glycol powder (GLYCOLAX/MIRALAX) powder, Take 17 g by mouth daily. (Patient taking differently: Take 17 g by mouth.), Disp: 3350  g, Rfl: 1   thyroid (NP THYROID) 30 MG tablet, Take 1 tablet (30 mg total) by mouth daily before breakfast., Disp: 30 tablet, Rfl: 3   Allergies  Allergen Reactions   Penicillins Anaphylaxis and Other (See Comments)    Possible serum like sickness vs angioedema  Has patient had a PCN reaction causing immediate rash, facial/tongue/throat swelling, SOB or lightheadedness with hypotension: Yes Has patient had a PCN reaction causing severe rash involving mucus membranes or skin necrosis: No Has patient had a PCN reaction that required hospitalization: Yes Has patient had a PCN reaction occurring within the last 10 years: Yes If all of the above answers are "NO", then may proceed with Cephalosporin use.   Augmentin [Amoxicillin-Pot Clavulanate] Other (See Comments)    Possible serum like sickness vs angioedema  Has patient had a PCN reaction causing immediate rash, facial/tongue/throat swelling, SOB or lightheadedness with hypotension: Yes Has patient had a PCN reaction causing severe rash involving mucus membranes or skin necrosis: No Has patient had a PCN reaction that required hospitalization: Yes Has patient had a PCN reaction occurring within the last 10 years: Yes If all of the above answers are "NO", then may proceed with Cephalosporin use.    Adhesive [Tape] Rash   Latex Rash    Past Medical History:  Diagnosis Date   Abdominal pain, recurrent    Asthma    since a baby,ususally with anxiety    Bilateral pes  planus    Depression    Diagnosed since 9th grade but feels like she has had it since 6th grade   Diarrhea    Headache(784.0)    Hypermobility syndrome    Diagnosed by [facility]    Migraine    Patellofemoral arthralgia of left knee    Scoliosis    Urinary tract infection      Past Surgical History:  Procedure Laterality Date   KNEE ARTHROSCOPY WITH MEDIAL PATELLAR FEMORAL LIGAMENT RECONSTRUCTION Left 07/26/2018   Procedure: LEFT KNEE ARTHROSCOPY WITH MEDIAL  PATELLAR FEMORAL LIGAMENT RECONSTRUCTION;  Surgeon: Bjorn Pippin, MD;  Location: [facility];  Service: Orthopedics;  Laterality: Left;   LABIOPLASTY Left 04/20/2018   Procedure: LEFT LABIAL REDUCTION;  Surgeon: [name], MD;  Location: [facility];  Service: Gynecology;  Laterality: Left;   URETERAL REIMPLANTION  2007   left-sided    Family History  Problem Relation Age of Onset   Irritable bowel syndrome Brother    Asthma Brother    Ulcers Maternal Grandmother    Depression Maternal Grandmother    Cancer Maternal Grandmother    Hyperlipidemia Maternal Grandmother    Fibromyalgia Maternal Grandmother    Ulcers Paternal Grandfather    Hypertension Paternal Grandfather    Migraines Mother    Bipolar disorder Mother    Depression Mother    Anxiety disorder Mother    Fibromyalgia Mother    Alcohol abuse Maternal Grandfather    Depression Other    Depression Maternal Aunt     Social History   Tobacco Use   Smoking status: Former    Types: Cigarettes, E-cigarettes    Quit date: 07/17/2019    Years since quitting: 1.9   Smokeless tobacco: Never   Tobacco comments:    mother vapes  Vaping Use   Vaping Use: Every day   Start date: 09/15/2017  Substance Use Topics   Alcohol use: Yes    Comment: once a month   Drug use: No    ROS   Objective:   Vitals: BP 120/83 (BP Location: Right Arm)   Pulse (!) 109   Temp 99.1 F (37.3 C) (Oral)   Resp 18   LMP 06/24/2021 (Exact Date)   SpO2 98%   Physical Exam Constitutional:      General: She is not in acute distress.    Appearance: Normal appearance. She is well-developed. She is not ill-appearing, toxic-appearing or diaphoretic.  HENT:     Head: Normocephalic and atraumatic.     Right Ear: Tympanic membrane, ear canal and external ear normal. No drainage or tenderness. No middle ear effusion. Tympanic membrane is not erythematous.     Left Ear: Tympanic membrane, ear canal and external ear normal. No  drainage or tenderness.  No middle ear effusion. Tympanic membrane is not erythematous.     Nose: Congestion and rhinorrhea present.     Mouth/Throat:     Mouth: Mucous membranes are moist. No oral lesions.     Pharynx: No pharyngeal swelling, oropharyngeal exudate, posterior oropharyngeal erythema or uvula swelling.     Tonsils: No tonsillar exudate or tonsillar abscesses.  Eyes:     General: No scleral icterus.       Right eye: No discharge.        Left eye: No discharge.     Extraocular Movements: Extraocular movements intact.     Right eye: Normal extraocular motion.     Left eye: Normal extraocular motion.  Conjunctiva/sclera: Conjunctivae normal.     Pupils: Pupils are equal, round, and reactive to light.  Cardiovascular:     Rate and Rhythm: Normal rate and regular rhythm.     Pulses: Normal pulses.     Heart sounds: Normal heart sounds. No murmur heard.   No friction rub. No gallop.  Pulmonary:     Effort: Pulmonary effort is normal. No respiratory distress.     Breath sounds: Normal breath sounds. No stridor. No wheezing, rhonchi or rales.  Musculoskeletal:     Cervical back: Normal range of motion and neck supple.  Lymphadenopathy:     Cervical: No cervical adenopathy.  Skin:    General: Skin is warm and dry.     Findings: No rash.  Neurological:     General: No focal deficit present.     Mental Status: She is alert and oriented to person, place, and time.  Psychiatric:        Mood and Affect: Mood normal.        Behavior: Behavior normal.        Thought Content: Thought content normal.        Judgment: Judgment normal.    Results for orders placed or performed during the hospital encounter of 07/14/21 (from the past 24 hour(s))  POCT rapid strep A     Status: None   Collection Time: 07/14/21  9:43 AM  Result Value Ref Range   Rapid Strep A Screen Negative Negative    Assessment and Plan :   PDMP not reviewed this encounter.  1. Viral URI with cough    2. Exposure to COVID-19 virus   3. Throat pain   4. Stuffy and runny nose    Respiratory panel pending. Will manage for viral illness such as viral URI, viral syndrome, viral rhinitis, COVID-19, influenza, RSV, pharyngitis. Recommended supportive care. Offered scripts for symptomatic relief. Testing is pending. Deferred imaging given clear cardiopulmonary exam, hemodynamically stable vital signs. Counseled patient on potential for adverse effects with medications prescribed/recommended today, ER and return-to-clinic precautions discussed, patient verbalized understanding.      Wallis Bamberg, [place] 07/14/21 [phone]

## 2021-07-15 LAB — COVID-19, FLU A+B NAA
Influenza A, NAA: NOT DETECTED
Influenza B, NAA: NOT DETECTED
SARS-CoV-2, NAA: NOT DETECTED

## 2021-07-16 LAB — CULTURE, GROUP A STREP (THRC)

## 2021-07-17 ENCOUNTER — Encounter: Payer: Self-pay | Admitting: Emergency Medicine

## 2021-07-17 ENCOUNTER — Ambulatory Visit
Admission: EM | Admit: 2021-07-17 | Discharge: 2021-07-17 | Disposition: A | Discharge status: Home / Self Care | Payer: BC Managed Care – PPO | Attending: Urgent Care | Admitting: Urgent Care

## 2021-07-17 DIAGNOSIS — R070 Pain in throat: Secondary | ICD-10-CM

## 2021-07-17 DIAGNOSIS — J3089 Other allergic rhinitis: Secondary | ICD-10-CM

## 2021-07-17 DIAGNOSIS — R52 Pain, unspecified: Secondary | ICD-10-CM

## 2021-07-17 DIAGNOSIS — J069 Acute upper respiratory infection, unspecified: Secondary | ICD-10-CM

## 2021-07-17 MED ORDER — PREDNISONE 10 MG PO TABS
40.0000 mg | ORAL_TABLET | Freq: Every day | ORAL | 0 refills | Status: DC
Start: 2021-07-17 — End: 2022-04-09

## 2021-07-17 NOTE — ED Triage Notes (Signed)
Sore throat and right side jaw,ear and neck pain since Sunday.  Was seen on Sunday and had a negative covid and flu test. Negative strep and throat culture.

## 2021-07-17 NOTE — ED Provider Notes (Signed)
[facility]   MRN: [id number] DOB: 2000/12/01  Subjective:   Victoria Key is a 20 y.o. female presenting for 4 day history of persistent throat pain runny and stuffy nose, coughing.  Symptoms are worse at night.  Was last seen on 07/14/2021, tested negative for everything as below. Was advised supportive care which she has done except for pseudoephedrine. No chest pain, shob, wheezing, fever. Has a history of strep.  Has a history of allergic rhinitis, asthma.  Does not feel like her symptoms are strep throat, asthma related.  No current facility-administered medications for this encounter.  Current Outpatient Medications:    ascorbic acid (VITAMIN C) 100 MG tablet, Take by mouth., Disp: , Rfl:    benzonatate (TESSALON) 100 MG capsule, Take 1-2 capsules (100-200 mg total) by mouth 3 (three) times daily as needed for cough., Disp: 60 capsule, Rfl: 0   carbamazepine (TEGRETOL) 200 MG tablet, Take 3 tablets (600 mg total) by mouth at bedtime., Disp: 90 tablet, Rfl: 1   cetirizine (ZYRTEC ALLERGY) 10 MG tablet, Take 1 tablet (10 mg total) by mouth daily., Disp: 30 tablet, Rfl: 0   Cholecalciferol (VITAMIN D3 PO), Take 10,000 Units by mouth at bedtime., Disp: , Rfl:    clonazePAM (KLONOPIN) 0.5 MG tablet, Take 1 tablet (0.5 mg total) by mouth daily as needed for anxiety., Disp: 30 tablet, Rfl: 2   FLUoxetine (PROZAC) 20 MG capsule, Take 1 capsule (20 mg total) by mouth daily., Disp: 90 capsule, Rfl: 3   ibuprofen (ADVIL) 600 MG tablet, Take 1 tablet (600 mg total) by mouth 3 (three) times daily. (Patient taking differently: Take 600 mg by mouth.), Disp: 60 tablet, Rfl: 3   ipratropium (ATROVENT) 0.03 % nasal spray, Place 2 sprays into both nostrils 2 (two) times daily., Disp: 30 mL, Rfl: 0   lamoTRIgine (LAMICTAL) 100 MG tablet, Take by mouth., Disp: , Rfl:    melatonin 5 MG TABS, Take 5 mg by mouth at bedtime., Disp: , Rfl:    norethindrone-ethinyl estradiol (LOESTRIN FE) 1-20  MG-MCG tablet, Take 1 tablet by mouth daily., Disp: 28 tablet, Rfl: 11   nystatin cream (MYCOSTATIN), Apply 1 application topically 2 (two) times daily. (Patient taking differently: Apply 1 application topically.), Disp: 30 g, Rfl: 0   ondansetron (ZOFRAN-ODT) 4 MG disintegrating tablet, Take 1 tablet (4 mg total) by mouth every 8 (eight) hours as needed for nausea or vomiting., Disp: 15 tablet, Rfl: 0   polyethylene glycol powder (GLYCOLAX/MIRALAX) powder, Take 17 g by mouth daily. (Patient taking differently: Take 17 g by mouth.), Disp: 3350 g, Rfl: 1   promethazine-dextromethorphan (PROMETHAZINE-DM) 6.25-15 MG/5ML syrup, Take 5 mLs by mouth at bedtime as needed for cough., Disp: 100 mL, Rfl: 0   pseudoephedrine (SUDAFED) 60 MG tablet, Take 1 tablet (60 mg total) by mouth every 8 (eight) hours as needed for congestion., Disp: 30 tablet, Rfl: 0   thyroid (NP THYROID) 30 MG tablet, Take 1 tablet (30 mg total) by mouth daily before breakfast., Disp: 30 tablet, Rfl: 3   Allergies  Allergen Reactions   Penicillins Anaphylaxis and Other (See Comments)    Possible serum like sickness vs angioedema  Has patient had a PCN reaction causing immediate rash, facial/tongue/throat swelling, SOB or lightheadedness with hypotension: Yes Has patient had a PCN reaction causing severe rash involving mucus membranes or skin necrosis: No Has patient had a PCN reaction that required hospitalization: Yes Has patient had a PCN reaction occurring within the last  10 years: Yes If all of the above answers are "NO", then may proceed with Cephalosporin use.   Augmentin [Amoxicillin-Pot Clavulanate] Other (See Comments)    Possible serum like sickness vs angioedema  Has patient had a PCN reaction causing immediate rash, facial/tongue/throat swelling, SOB or lightheadedness with hypotension: Yes Has patient had a PCN reaction causing severe rash involving mucus membranes or skin necrosis: No Has patient had a PCN reaction  that required hospitalization: Yes Has patient had a PCN reaction occurring within the last 10 years: Yes If all of the above answers are "NO", then may proceed with Cephalosporin use.    Adhesive [Tape] Rash   Latex Rash    Past Medical History:  Diagnosis Date   Abdominal pain, recurrent    Asthma    since a baby,ususally with anxiety    Bilateral pes planus    Depression    Diagnosed since 9th grade but feels like she has had it since 6th grade   Diarrhea    Headache(784.0)    Hypermobility syndrome    Diagnosed by [facility]    Migraine    Patellofemoral arthralgia of left knee    Scoliosis    Urinary tract infection      Past Surgical History:  Procedure Laterality Date   KNEE ARTHROSCOPY WITH MEDIAL PATELLAR FEMORAL LIGAMENT RECONSTRUCTION Left 07/26/2018   Procedure: LEFT KNEE ARTHROSCOPY WITH MEDIAL PATELLAR FEMORAL LIGAMENT RECONSTRUCTION;  Surgeon: Bjorn Pippin, MD;  Location: [facility];  Service: Orthopedics;  Laterality: Left;   LABIOPLASTY Left 04/20/2018   Procedure: LEFT LABIAL REDUCTION;  Surgeon: [name], MD;  Location: [facility];  Service: Gynecology;  Laterality: Left;   URETERAL REIMPLANTION  2007   left-sided    Family History  Problem Relation Age of Onset   Irritable bowel syndrome Brother    Asthma Brother    Ulcers Maternal Grandmother    Depression Maternal Grandmother    Cancer Maternal Grandmother    Hyperlipidemia Maternal Grandmother    Fibromyalgia Maternal Grandmother    Ulcers Paternal Grandfather    Hypertension Paternal Grandfather    Migraines Mother    Bipolar disorder Mother    Depression Mother    Anxiety disorder Mother    Fibromyalgia Mother    Alcohol abuse Maternal Grandfather    Depression Other    Depression Maternal Aunt     Social History   Tobacco Use   Smoking status: Former    Types: Cigarettes, E-cigarettes    Quit date: 07/17/2019    Years since quitting: 2.0   Smokeless  tobacco: Never   Tobacco comments:    mother vapes  Vaping Use   Vaping Use: Every day   Start date: 09/15/2017  Substance Use Topics   Alcohol use: Yes    Comment: once a month   Drug use: No    ROS   Objective:   Vitals: BP 123/71 (BP Location: Right Arm)   Pulse 89   Temp 98.1 F (36.7 C) (Oral)   Resp 18   LMP 06/24/2021 (Exact Date)   SpO2 97%   Physical Exam Constitutional:      General: She is not in acute distress.    Appearance: She is well-developed. She is not ill-appearing, toxic-appearing or diaphoretic.  HENT:     Head: Normocephalic and atraumatic.     Right Ear: Tympanic membrane, ear canal and external ear normal. No drainage or tenderness. No middle ear effusion. Tympanic membrane  is not erythematous.     Left Ear: Tympanic membrane, ear canal and external ear normal. No drainage or tenderness.  No middle ear effusion. Tympanic membrane is not erythematous.     Nose: No congestion or rhinorrhea.     Mouth/Throat:     Mouth: Mucous membranes are moist. No oral lesions.     Pharynx: No pharyngeal swelling, oropharyngeal exudate, posterior oropharyngeal erythema or uvula swelling.     Tonsils: No tonsillar exudate or tonsillar abscesses.  Eyes:     Extraocular Movements: Extraocular movements intact.     Right eye: Normal extraocular motion.     Left eye: Normal extraocular motion.     Conjunctiva/sclera: Conjunctivae normal.     Pupils: Pupils are equal, round, and reactive to light.  Cardiovascular:     Rate and Rhythm: Normal rate.  Pulmonary:     Effort: Pulmonary effort is normal.  Musculoskeletal:     Cervical back: Normal range of motion and neck supple.  Lymphadenopathy:     Cervical: No cervical adenopathy.  Skin:    General: Skin is warm and dry.  Neurological:     General: No focal deficit present.     Mental Status: She is alert and oriented to person, place, and time.  Psychiatric:        Mood and Affect: Mood normal.         Behavior: Behavior normal.    Results for orders placed or performed during the hospital encounter of 07/14/21 (from the past 72 hour(s))  Covid-19, Flu A+B ([facility])     Status: None   Collection Time: 07/14/21  9:28 AM   Specimen: Nasal Swab; Nasopharyngeal   Naso  Result Value Ref Range   SARS-CoV-2, NAA Not Detected Not Detected   Influenza A, NAA Not Detected Not Detected   Influenza B, NAA Not Detected Not Detected   Test Information: Comment     Comment: This nucleic acid amplification test was developed and its performance characteristics determined by [employer]. Nucleic acid amplification tests include RT-PCR and TMA. This test has not been FDA cleared or approved. This test has been authorized by FDA under an Emergency Use Authorization (EUA). This test is only authorized for the duration of time the declaration that circumstances exist justifying the authorization of the emergency use of in vitro diagnostic tests for detection of SARS-CoV-2 virus and/or diagnosis of COVID-19 infection under section 564(b)(1) of the Act, 21 U.S.C. 480XKP-5(V) (1), unless the authorization is terminated or revoked sooner. When diagnostic testing is negative, the possibility of a false negative result should be considered in the context of a patient's recent exposures and the presence of clinical signs and symptoms consistent with COVID-19. An individual without symptoms of COVID-19 and who is not shedding SARS-CoV-2 virus wo uld expect to have a negative (not detected) result in this assay.   POCT rapid strep A     Status: None   Collection Time: 07/14/21  9:43 AM  Result Value Ref Range   Rapid Strep A Screen Negative Negative  Culture, group A strep     Status: None   Collection Time: 07/14/21  9:51 AM   Specimen: Throat  Result Value Ref Range   Specimen Description      THROAT Performed at [facility], [address] [place], [place] [address]    Special Requests       NONE Performed at [facility], [address], [place], [place] [address]  Culture      NO GROUP A STREP (S.PYOGENES) ISOLATED Performed at [facility], [address] [place], [place] [address]    Report Status 07/16/2021 FINAL     Assessment and Plan :   PDMP not reviewed this encounter.  1. Viral upper respiratory illness   2. Throat pain in adult   3. Body aches   4. Allergic rhinitis due to other allergic trigger, unspecified seasonality    Discussed antibiotic stewardship.  Given that she has had 4 days of symptoms and benign physical exam findings, hemodynamically stable vital signs will avoid antibiotic use.  Offered her repeat strep test but she declined.  No need to repeat viral testing as she notes some improvement in her body aches.  Offered her an oral prednisone course to address her sinus inflammation in the setting of a history of allergic rhinitis. Counseled patient on potential for adverse effects with medications prescribed/recommended today, ER and return-to-clinic precautions discussed, patient verbalized understanding.    Wallis Bamberg, [place] 07/17/21 [phone]

## 2021-10-18 ENCOUNTER — Other Ambulatory Visit (HOSPITAL_COMMUNITY): Payer: Self-pay | Admitting: Psychiatry

## 2021-12-05 ENCOUNTER — Other Ambulatory Visit: Payer: Self-pay

## 2021-12-05 ENCOUNTER — Ambulatory Visit
Admission: EM | Admit: 2021-12-05 | Discharge: 2021-12-05 | Disposition: A | Discharge status: Home / Self Care | Payer: BC Managed Care – PPO | Attending: Urgent Care | Admitting: Urgent Care

## 2021-12-05 ENCOUNTER — Other Ambulatory Visit: Payer: BC Managed Care – PPO

## 2021-12-05 DIAGNOSIS — J029 Acute pharyngitis, unspecified: Secondary | ICD-10-CM | POA: Diagnosis not present

## 2021-12-05 DIAGNOSIS — R109 Unspecified abdominal pain: Secondary | ICD-10-CM | POA: Diagnosis not present

## 2021-12-05 DIAGNOSIS — R070 Pain in throat: Secondary | ICD-10-CM | POA: Insufficient documentation

## 2021-12-05 DIAGNOSIS — R110 Nausea: Secondary | ICD-10-CM | POA: Insufficient documentation

## 2021-12-05 DIAGNOSIS — Z7251 High risk heterosexual behavior: Secondary | ICD-10-CM | POA: Insufficient documentation

## 2021-12-05 LAB — POCT RAPID STREP A (OFFICE): Rapid Strep A Screen: NEGATIVE

## 2021-12-05 LAB — POCT URINE PREGNANCY: Preg Test, Ur: NEGATIVE

## 2021-12-05 MED ORDER — IBUPROFEN 600 MG PO TABS: 600.0000 mg | ORAL_TABLET | Freq: Four times a day (QID) | ORAL | 0 refills | Status: DC | PRN

## 2021-12-05 NOTE — ED Provider Notes (Signed)
?[facility] ? ? ?MRN: [id number] DOB: 23-Apr-2001 ? ?Subjective:  ? ?Victoria Key is a 21 y.o. female presenting for 1 day history of acute onset throat pain, fever this morning.  Has also had pelvic and abdominal cramps.  Cycle was at the beginning of this month on March 3 through 4.  Patient is sexually active with 1 female partner, does not use condoms for protection.  Would like to be tested for strep, sexually transmitted infections of the throat and through a cervical swab.  No vaginal discharge.  No genital rashes.  No dysuria, urinary frequency, hematuria. ? ?No current facility-administered medications for this encounter. ? ?Current Outpatient Medications:  ?  ascorbic acid (VITAMIN C) 100 MG tablet, Take by mouth., Disp: , Rfl:  ?  benzonatate (TESSALON) 100 MG capsule, Take 1-2 capsules (100-200 mg total) by mouth 3 (three) times daily as needed for cough., Disp: 60 capsule, Rfl: 0 ?  carbamazepine (TEGRETOL) 200 MG tablet, Take 3 tablets (600 mg total) by mouth at bedtime., Disp: 90 tablet, Rfl: 1 ?  cetirizine (ZYRTEC ALLERGY) 10 MG tablet, Take 1 tablet (10 mg total) by mouth daily., Disp: 30 tablet, Rfl: 0 ?  Cholecalciferol (VITAMIN D3 PO), Take 10,000 Units by mouth at bedtime., Disp: , Rfl:  ?  clonazePAM (KLONOPIN) 0.5 MG tablet, Take 1 tablet (0.5 mg total) by mouth daily as needed for anxiety., Disp: 30 tablet, Rfl: 2 ?  FLUoxetine (PROZAC) 20 MG capsule, Take 1 capsule (20 mg total) by mouth daily., Disp: 90 capsule, Rfl: 3 ?  ibuprofen (ADVIL) 600 MG tablet, Take 1 tablet (600 mg total) by mouth 3 (three) times daily. (Patient taking differently: Take 600 mg by mouth.), Disp: 60 tablet, Rfl: 3 ?  ipratropium (ATROVENT) 0.03 % nasal spray, Place 2 sprays into both nostrils 2 (two) times daily., Disp: 30 mL, Rfl: 0 ?  lamoTRIgine (LAMICTAL) 100 MG tablet, Take by mouth., Disp: , Rfl:  ?  melatonin 5 MG TABS, Take 5 mg by mouth at bedtime., Disp: , Rfl:  ?  norethindrone-ethinyl  estradiol (LOESTRIN FE) 1-20 MG-MCG tablet, Take 1 tablet by mouth daily., Disp: 28 tablet, Rfl: 11 ?  nystatin cream (MYCOSTATIN), Apply 1 application topically 2 (two) times daily. (Patient taking differently: Apply 1 application topically.), Disp: 30 g, Rfl: 0 ?  ondansetron (ZOFRAN-ODT) 4 MG disintegrating tablet, Take 1 tablet (4 mg total) by mouth every 8 (eight) hours as needed for nausea or vomiting., Disp: 15 tablet, Rfl: 0 ?  polyethylene glycol powder (GLYCOLAX/MIRALAX) powder, Take 17 g by mouth daily. (Patient taking differently: Take 17 g by mouth.), Disp: 3350 g, Rfl: 1 ?  predniSONE (DELTASONE) 10 MG tablet, Take 4 tablets (40 mg total) by mouth daily with breakfast., Disp: 20 tablet, Rfl: 0 ?  promethazine-dextromethorphan (PROMETHAZINE-DM) 6.25-15 MG/5ML syrup, Take 5 mLs by mouth at bedtime as needed for cough., Disp: 100 mL, Rfl: 0 ?  pseudoephedrine (SUDAFED) 60 MG tablet, Take 1 tablet (60 mg total) by mouth every 8 (eight) hours as needed for congestion., Disp: 30 tablet, Rfl: 0 ?  thyroid (NP THYROID) 30 MG tablet, Take 1 tablet (30 mg total) by mouth daily before breakfast., Disp: 30 tablet, Rfl: 3  ? ?Allergies  ?Allergen Reactions  ? Latex Rash and Itching  ?  Band aids ?Band aids  ? Penicillins Anaphylaxis and Other (See Comments)  ?  Possible serum like sickness vs angioedema  ?Has patient had a PCN reaction causing immediate  rash, facial/tongue/throat swelling, SOB or lightheadedness with hypotension: Yes ?Has patient had a PCN reaction causing severe rash involving mucus membranes or skin necrosis: No ?Has patient had a PCN reaction that required hospitalization: Yes ?Has patient had a PCN reaction occurring within the last 10 years: Yes ?If all of the above answers are "NO", then may proceed with Cephalosporin use.  ? Amoxicillin-Pot Clavulanate Other (See Comments)  ?  Possible serum like sickness vs angioedema  ?Has patient had a PCN reaction causing immediate rash,  facial/tongue/throat swelling, SOB or lightheadedness with hypotension: Yes ?Has patient had a PCN reaction causing severe rash involving mucus membranes or skin necrosis: No ?Has patient had a PCN reaction that required hospitalization: Yes ?Has patient had a PCN reaction occurring within the last 10 years: Yes ?If all of the above answers are "NO", then may proceed with Cephalosporin use. ? ?Stroke like reaction  ? Adhesive [Tape] Rash  ? ? ?Past Medical History:  ?Diagnosis Date  ? Abdominal pain, recurrent   ? Asthma   ? since a baby,ususally with anxiety   ? Bilateral pes planus   ? Depression   ? Diagnosed since 9th grade but feels like she has had it since 6th grade  ? Diarrhea   ? Headache(784.0)   ? Hypermobility syndrome   ? Diagnosed by [facility]   ? Migraine   ? Patellofemoral arthralgia of left knee   ? Scoliosis   ? Urinary tract infection   ?  ? ?Past Surgical History:  ?Procedure Laterality Date  ? KNEE ARTHROSCOPY WITH MEDIAL PATELLAR FEMORAL LIGAMENT RECONSTRUCTION Left 07/26/2018  ? Procedure: LEFT KNEE ARTHROSCOPY WITH MEDIAL PATELLAR FEMORAL LIGAMENT RECONSTRUCTION;  Surgeon: Bjorn PippinVarkey, Dax T, MD;  Location: [facility];  Service: Orthopedics;  Laterality: Left;  ? LABIOPLASTY Left 04/20/2018  ? Procedure: LEFT LABIAL REDUCTION;  Surgeon: Tilda BurrowFerguson, John V, MD;  Location: [facility];  Service: Gynecology;  Laterality: Left;  ? URETERAL REIMPLANTION  2007  ? left-sided  ? ? ?Family History  ?Problem Relation Age of Onset  ? Irritable bowel syndrome Brother   ? Asthma Brother   ? Ulcers Maternal Grandmother   ? Depression Maternal Grandmother   ? Cancer Maternal Grandmother   ? Hyperlipidemia Maternal Grandmother   ? Fibromyalgia Maternal Grandmother   ? Ulcers Paternal Grandfather   ? Hypertension Paternal Grandfather   ? Migraines Mother   ? Bipolar disorder Mother   ? Depression Mother   ? Anxiety disorder Mother   ? Fibromyalgia Mother   ? Alcohol abuse Maternal Grandfather   ?  Depression Other   ? Depression Maternal Aunt   ? ? ?Social History  ? ?Tobacco Use  ? Smoking status: Former  ?  Types: Cigarettes, E-cigarettes  ?  Quit date: 07/17/2019  ?  Years since quitting: 2.3  ? Smokeless tobacco: Never  ? Tobacco comments:  ?  mother vapes  ?Vaping Use  ? Vaping Use: Every day  ? Start date: 09/15/2017  ?Substance Use Topics  ? Alcohol use: Yes  ?  Comment: once a month  ? Drug use: No  ? ? ?ROS ? ? ?Objective:  ? ?Vitals: ?LMP 11/15/2021 (Exact Date)  ? ?Physical Exam ?Constitutional:   ?   General: She is not in acute distress. ?   Appearance: Normal appearance. She is well-developed. She is not ill-appearing, toxic-appearing or diaphoretic.  ?HENT:  ?   Head: Normocephalic and atraumatic.  ?   Nose: Nose normal.  ?  Mouth/Throat:  ?   Mouth: Mucous membranes are moist.  ?   Pharynx: Oropharyngeal exudate and posterior oropharyngeal erythema present. No pharyngeal swelling or uvula swelling.  ?   Tonsils: No tonsillar exudate or tonsillar abscesses. 0 on the right. 0 on the left.  ?Eyes:  ?   General: No scleral icterus.    ?   Right eye: No discharge.     ?   Left eye: No discharge.  ?   Extraocular Movements: Extraocular movements intact.  ?   Conjunctiva/sclera: Conjunctivae normal.  ?Cardiovascular:  ?   Rate and Rhythm: Normal rate.  ?Pulmonary:  ?   Effort: Pulmonary effort is normal.  ?Abdominal:  ?   General: Bowel sounds are normal. There is no distension.  ?   Palpations: Abdomen is soft. There is no mass.  ?   Tenderness: There is no abdominal tenderness. There is no right CVA tenderness, left CVA tenderness, guarding or rebound.  ?Skin: ?   General: Skin is warm and dry.  ?Neurological:  ?   General: No focal deficit present.  ?   Mental Status: She is alert and oriented to person, place, and time.  ?Psychiatric:     ?   Mood and Affect: Mood normal.     ?   Behavior: Behavior normal.     ?   Thought Content: Thought content normal.     ?   Judgment: Judgment normal.   ? ? ?Results for orders placed or performed during the hospital encounter of 12/05/21 (from the past 24 hour(s))  ?POCT rapid strep A     Status: None  ? Collection Time: 12/05/21 12:59 PM  ?Result Value Ref Range  ? Rapid Str

## 2021-12-05 NOTE — ED Triage Notes (Signed)
Pt reports fever this morning;  sore throat x 1 day.  ? ?Pt requested STD's and pregnancy test.  ?

## 2021-12-06 ENCOUNTER — Telehealth (HOSPITAL_COMMUNITY): Payer: Self-pay | Admitting: Emergency Medicine

## 2021-12-06 LAB — CYTOLOGY, (ORAL, ANAL, URETHRAL) ANCILLARY ONLY
Chlamydia: NEGATIVE
Comment: NEGATIVE
Comment: NEGATIVE
Comment: NORMAL
Neisseria Gonorrhea: POSITIVE — AB
Trichomonas: NEGATIVE

## 2021-12-06 LAB — CERVICOVAGINAL ANCILLARY ONLY
Bacterial Vaginitis (gardnerella): POSITIVE — AB
Chlamydia: NEGATIVE
Comment: NEGATIVE
Comment: NEGATIVE
Comment: NEGATIVE
Comment: NORMAL
Neisseria Gonorrhea: POSITIVE — AB
Trichomonas: NEGATIVE

## 2021-12-06 MED ORDER — METRONIDAZOLE 500 MG PO TABS: 500.0000 mg | ORAL_TABLET | Freq: Two times a day (BID) | ORAL | 0 refills | Status: DC

## 2021-12-07 ENCOUNTER — Ambulatory Visit
Admission: RE | Admit: 2021-12-07 | Discharge: 2021-12-07 | Disposition: A | Discharge status: Home / Self Care | Payer: BC Managed Care – PPO | Source: Ambulatory Visit | Attending: Student | Admitting: Student

## 2021-12-07 ENCOUNTER — Other Ambulatory Visit: Payer: Self-pay

## 2021-12-07 MED ORDER — AZITHROMYCIN 500 MG PO TABS
2000.0000 mg | ORAL_TABLET | Freq: Once | ORAL | Status: AC
  Administered 2021-12-07: 2000 mg via ORAL

## 2021-12-07 NOTE — ED Provider Notes (Signed)
Patient is penicillin allergic (anaphylaxis), and positive for gonorrhea.  We do not have gentamicin in stock in this clinic.  We will treat with azithromycin 2 g as per CDC recommendation.  Retest in 3 months or sooner if symptomatic.  Abstain from intercourse for 7 days. ?  ?Rhys Martini, PA-C ?12/07/21 1402 ? ?

## 2021-12-07 NOTE — ED Notes (Signed)
PA consulted and recommended 2000mg  abx and no intercourse x1 week, and re-check for STD x3 months. Pt verbalized understanding of care plan and elected to take one dose of 8 tablets 250mg  per pill of azithromycin PO at [facility] rather than electronic prescription. ?

## 2021-12-07 NOTE — ED Triage Notes (Signed)
Pt states she came in for a shot because she was diagnosed with gonorrhea  ?

## 2021-12-08 LAB — CULTURE, GROUP A STREP (THRC)

## 2021-12-18 ENCOUNTER — Ambulatory Visit: Payer: Self-pay

## 2022-01-16 ENCOUNTER — Encounter: Payer: Self-pay | Admitting: *Deleted

## 2022-02-11 ENCOUNTER — Ambulatory Visit
Admission: RE | Admit: 2022-02-11 | Discharge: 2022-02-11 | Disposition: A | Discharge status: Home / Self Care | Payer: BC Managed Care – PPO | Source: Ambulatory Visit | Attending: Family Medicine | Admitting: Family Medicine

## 2022-02-11 VITALS — BP 126/80 | HR 86 | Temp 98.2°F | Resp 18

## 2022-02-11 DIAGNOSIS — B3731 Acute candidiasis of vulva and vagina: Secondary | ICD-10-CM | POA: Diagnosis not present

## 2022-02-11 DIAGNOSIS — R112 Nausea with vomiting, unspecified: Secondary | ICD-10-CM | POA: Insufficient documentation

## 2022-02-11 DIAGNOSIS — N760 Acute vaginitis: Secondary | ICD-10-CM | POA: Diagnosis not present

## 2022-02-11 DIAGNOSIS — R197 Diarrhea, unspecified: Secondary | ICD-10-CM | POA: Diagnosis not present

## 2022-02-11 LAB — POCT URINE PREGNANCY: Preg Test, Ur: NEGATIVE

## 2022-02-11 MED ORDER — ONDANSETRON 4 MG PO TBDP: 4.0000 mg | ORAL_TABLET | Freq: Three times a day (TID) | ORAL | 0 refills | Status: DC | PRN

## 2022-02-11 MED ORDER — ONDANSETRON 4 MG PO TBDP
4.0000 mg | ORAL_TABLET | Freq: Once | ORAL | Status: AC
  Administered 2022-02-11: 4 mg via ORAL

## 2022-02-11 NOTE — ED Triage Notes (Signed)
Pt states that she woke up vomiting and bad diarrhea  Pt states she is nauseas but not vomiting anymore  Denies Fever  Denies Meds

## 2022-02-11 NOTE — ED Provider Notes (Signed)
[facility]    CSN: [id number] Arrival date & time: 02/11/22  1648      History   Chief Complaint Chief Complaint  Patient presents with   Nausea    I woke up throwing up and having diarrhea. - Entered by patient    HPI Victoria Key is a 21 y.o. female.   Presenting today with nausea, vomiting, diarrhea since waking this morning.  Still feels very nauseated and having chills throughout the day but no active vomiting since this afternoon.  Denies fever, body aches, cough, congestion, chest pain, shortness of breath, rashes.  So far not trying any medications over-the-counter for symptoms.  States roommates having similar symptoms currently.  No known pertinent chronic medical problems.   Past Medical History:  Diagnosis Date   Abdominal pain, recurrent    Asthma    since a baby,ususally with anxiety    Bilateral pes planus    Depression    Diagnosed since 9th grade but feels like she has had it since 6th grade   Diarrhea    Headache(784.0)    Hypermobility syndrome    Diagnosed by [facility]    Migraine    Patellofemoral arthralgia of left knee    Scoliosis    Urinary tract infection     Patient Active Problem List   Diagnosis Date Noted   Encounter for surveillance of contraceptive pills 09/03/2020   Abdominal cramping 09/03/2020   RLQ abdominal pain 05/18/2020   Encounter for initial prescription of contraceptive pills 05/18/2020   Pregnancy examination or test, negative result 05/18/2020   Screening examination for STD (sexually transmitted disease) 10/10/2019   History of trichomoniasis 10/10/2019   Trichomonas infection 08/30/2019   Body mass index (BMI) of 30.0 to 30.9 in adult 06/29/2019   Slow transit constipation 10/18/2018   Menstrual migraine without status migrainosus, not intractable 03/30/2018   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   Car sickness 06/11/2017   Chronic generalized pain 06/11/2017   GERD  (gastroesophageal reflux disease) 11/07/2016   Adolescent depression 11/07/2016   Scoliosis deformity of spine 11/07/2016   Acne vulgaris 10/27/2016   Premenstrual dysphoric syndrome 10/27/2016   Dysmenorrhea in adolescent 10/27/2016   Anxiety state 12/16/2013   Migraine without aura and without status migrainosus, not intractable 10/17/2013   Tension headache 10/17/2013    Past Surgical History:  Procedure Laterality Date   KNEE ARTHROSCOPY WITH MEDIAL PATELLAR FEMORAL LIGAMENT RECONSTRUCTION Left 07/26/2018   Procedure: LEFT KNEE ARTHROSCOPY WITH MEDIAL PATELLAR FEMORAL LIGAMENT RECONSTRUCTION;  Surgeon: Bjorn Pippin, MD;  Location: [facility];  Service: Orthopedics;  Laterality: Left;   LABIOPLASTY Left 04/20/2018   Procedure: LEFT LABIAL REDUCTION;  Surgeon: [name], MD;  Location: [facility];  Service: Gynecology;  Laterality: Left;   URETERAL REIMPLANTION  2007   left-sided    OB History     Gravida  0   Para  0   Term  0   Preterm  0   AB  0   Living  0      SAB  0   IAB  0   Ectopic  0   Multiple  0   Live Births  0            Home Medications    Prior to Admission medications   Medication Sig Start Date End Date Taking? Authorizing Provider  ondansetron (ZOFRAN-ODT) 4 MG disintegrating tablet Take 1 tablet (4 mg total) by  mouth every 8 (eight) hours as needed for nausea or vomiting. 02/11/22  Yes Particia NearingLane, Giorgia Wahler Elizabeth, PA-C  ascorbic acid (VITAMIN C) 100 MG tablet Take by mouth.    [provider]  benzonatate (TESSALON) 100 MG capsule Take 1-2 capsules (100-200 mg total) by mouth 3 (three) times daily as needed for cough. 07/14/21   Wallis BambergMani, Mario, PA-C  carbamazepine (TEGRETOL) 200 MG tablet Take 3 tablets (600 mg total) by mouth at bedtime. 12/03/20   Myrlene Brokeross, Deborah R, MD  cetirizine (ZYRTEC ALLERGY) 10 MG tablet Take 1 tablet (10 mg total) by mouth daily. 07/14/21   Wallis BambergMani, Mario, PA-C  Cholecalciferol (VITAMIN D3 PO)  Take 10,000 Units by mouth at bedtime.    [provider]  clonazePAM (KLONOPIN) 0.5 MG tablet Take 1 tablet (0.5 mg total) by mouth daily as needed for anxiety. 11/12/20 11/12/21  Myrlene Brokeross, Deborah R, MD  FLUoxetine (PROZAC) 20 MG capsule Take 1 capsule (20 mg total) by mouth daily. 11/12/20   Myrlene Brokeross, Deborah R, MD  ibuprofen (ADVIL) 600 MG tablet Take 1 tablet (600 mg total) by mouth every 6 (six) hours as needed. 12/05/21   Wallis BambergMani, Mario, PA-C  ipratropium (ATROVENT) 0.03 % nasal spray Place 2 sprays into both nostrils 2 (two) times daily. 07/14/21   Wallis BambergMani, Mario, PA-C  lamoTRIgine (LAMICTAL) 100 MG tablet Take by mouth.    [provider]  melatonin 5 MG TABS Take 5 mg by mouth at bedtime.    [provider]  metroNIDAZOLE (FLAGYL) 500 MG tablet Take 1 tablet (500 mg total) by mouth 2 (two) times daily. 12/06/21   LampteyBritta Mccreedy, Philip O, MD  norethindrone-ethinyl estradiol (LOESTRIN FE) 1-20 MG-MCG tablet Take 1 tablet by mouth daily. 09/03/20 09/03/21  Adline PotterGriffin, Jennifer A, NP  nystatin cream (MYCOSTATIN) Apply 1 application topically 2 (two) times daily. Patient taking differently: Apply 1 application. topically. 08/03/19   Fredia Sorrowaylor, Patricia A, NP  ondansetron (ZOFRAN-ODT) 4 MG disintegrating tablet Take 1 tablet (4 mg total) by mouth every 8 (eight) hours as needed for nausea or vomiting. 04/24/21   Mardella LaymanHagler, Brian, MD  polyethylene glycol powder (GLYCOLAX/MIRALAX) powder Take 17 g by mouth daily. Patient taking differently: Take 17 g by mouth. 08/06/16   McDonell, Alfredia ClientMary Jo, MD  predniSONE (DELTASONE) 10 MG tablet Take 4 tablets (40 mg total) by mouth daily with breakfast. 07/17/21   Wallis BambergMani, Mario, PA-C  promethazine-dextromethorphan (PROMETHAZINE-DM) 6.25-15 MG/5ML syrup Take 5 mLs by mouth at bedtime as needed for cough. 07/14/21   Wallis BambergMani, Mario, PA-C  pseudoephedrine (SUDAFED) 60 MG tablet Take 1 tablet (60 mg total) by mouth every 8 (eight) hours as needed for congestion. 07/14/21   Wallis BambergMani,  Mario, PA-C  thyroid (NP THYROID) 30 MG tablet Take 1 tablet (30 mg total) by mouth daily before breakfast. 03/12/20   Wilson SingerGosrani, Nimish C, MD    Family History Family History  Problem Relation Age of Onset   Irritable bowel syndrome Brother    Asthma Brother    Ulcers Maternal Grandmother    Depression Maternal Grandmother    Cancer Maternal Grandmother    Hyperlipidemia Maternal Grandmother    Fibromyalgia Maternal Grandmother    Ulcers Paternal Grandfather    Hypertension Paternal Grandfather    Migraines Mother    Bipolar disorder Mother    Depression Mother    Anxiety disorder Mother    Fibromyalgia Mother    Alcohol abuse Maternal Grandfather    Depression Other    Depression Maternal Aunt  Social History Social History   Tobacco Use   Smoking status: Former    Types: Cigarettes, E-cigarettes    Quit date: 07/17/2019    Years since quitting: 2.5    Passive exposure: Never   Smokeless tobacco: Never   Tobacco comments:    mother vapes  Vaping Use   Vaping Use: Every day   Start date: 09/15/2017  Substance Use Topics   Alcohol use: Yes    Comment: once a month   Drug use: Yes    Types: Marijuana     Allergies   Latex, Penicillins, Amoxicillin-pot clavulanate, and Adhesive [tape]   Review of Systems Review of Systems Per HPI  Physical Exam Triage Vital Signs ED Triage Vitals  Enc Vitals Group     BP 02/11/22 1704 126/80     Pulse Rate 02/11/22 1704 86     Resp 02/11/22 1704 18     Temp 02/11/22 1704 98.2 F (36.8 C)     Temp Source 02/11/22 1704 Oral     SpO2 02/11/22 1704 97 %     Weight --      Height --      Head Circumference --      Peak Flow --      Pain Score 02/11/22 1700 4     Pain Loc --      Pain Edu? --      Excl. in GC? --    No data found.  Updated Vital Signs BP 126/80 (BP Location: Right Arm)   Pulse 86   Temp 98.2 F (36.8 C) (Oral)   Resp 18   LMP 01/18/2022 (Approximate)   SpO2 97%   Visual Acuity Right Eye  Distance:   Left Eye Distance:   Bilateral Distance:    Right Eye Near:   Left Eye Near:    Bilateral Near:     Physical Exam Vitals and nursing note reviewed.  Constitutional:      Appearance: Normal appearance. She is not ill-appearing.  HENT:     Head: Atraumatic.     Mouth/Throat:     Mouth: Mucous membranes are moist.  Eyes:     Extraocular Movements: Extraocular movements intact.     Conjunctiva/sclera: Conjunctivae normal.  Cardiovascular:     Rate and Rhythm: Normal rate and regular rhythm.     Heart sounds: Normal heart sounds.  Pulmonary:     Effort: Pulmonary effort is normal.     Breath sounds: Normal breath sounds.  Abdominal:     General: Bowel sounds are normal. There is no distension.     Palpations: Abdomen is soft.     Tenderness: There is abdominal tenderness. There is no right CVA tenderness, left CVA tenderness, guarding or rebound.     Comments: Minimal mid to lower abdominal tenderness to palpation without distention or guarding.  Musculoskeletal:        General: Normal range of motion.     Cervical back: Normal range of motion and neck supple.  Skin:    General: Skin is warm and dry.  Neurological:     Mental Status: She is alert and oriented to person, place, and time.  Psychiatric:        Mood and Affect: Mood normal.        Thought Content: Thought content normal.        Judgment: Judgment normal.   UC Treatments / Results  Labs (all labs ordered are listed, but only abnormal results are displayed)  Labs Reviewed  POCT URINE PREGNANCY  CERVICOVAGINAL ANCILLARY ONLY    EKG   Radiology No results found.  Procedures Procedures (including critical care time)  Medications Ordered in UC Medications  ondansetron (ZOFRAN-ODT) disintegrating tablet 4 mg (4 mg Oral Given 02/11/22 1742)    Initial Impression / Assessment and Plan / UC Course  I have reviewed the triage vital signs and the nursing notes.  Pertinent labs & imaging results  that were available during my care of the patient were reviewed by me and considered in my medical decision making (see chart for details).     Urine pregnancy negative, vitals and exam overall very reassuring.  Suspect viral GI illness.  Treat with Zofran, brat diet, fluids.  Return for worsening symptoms.  Final Clinical Impressions(s) / UC Diagnoses   Final diagnoses:  Nausea vomiting and diarrhea   Discharge Instructions   None    ED Prescriptions     Medication Sig Dispense Auth. Provider   ondansetron (ZOFRAN-ODT) 4 MG disintegrating tablet Take 1 tablet (4 mg total) by mouth every 8 (eight) hours as needed for nausea or vomiting. 20 tablet Particia Nearing, [place]      PDMP not reviewed this encounter.   Particia Nearing, [place] 02/11/22 1754

## 2022-02-12 ENCOUNTER — Telehealth (HOSPITAL_COMMUNITY): Payer: Self-pay | Admitting: Emergency Medicine

## 2022-02-12 LAB — CERVICOVAGINAL ANCILLARY ONLY
Bacterial Vaginitis (gardnerella): POSITIVE — AB
Candida Glabrata: NEGATIVE
Candida Vaginitis: POSITIVE — AB
Chlamydia: NEGATIVE
Comment: NEGATIVE
Comment: NEGATIVE
Comment: NEGATIVE
Comment: NEGATIVE
Comment: NEGATIVE
Comment: NORMAL
Neisseria Gonorrhea: NEGATIVE
Trichomonas: NEGATIVE

## 2022-02-12 MED ORDER — FLUCONAZOLE 150 MG PO TABS: 150.0000 mg | ORAL_TABLET | Freq: Once | ORAL | 0 refills | Status: AC

## 2022-02-12 MED ORDER — METRONIDAZOLE 500 MG PO TABS: 500.0000 mg | ORAL_TABLET | Freq: Two times a day (BID) | ORAL | 0 refills | Status: DC

## 2022-03-21 ENCOUNTER — Encounter: Payer: Self-pay | Admitting: Emergency Medicine

## 2022-03-21 ENCOUNTER — Other Ambulatory Visit: Payer: Self-pay

## 2022-03-21 ENCOUNTER — Ambulatory Visit: Payer: Self-pay

## 2022-03-21 ENCOUNTER — Ambulatory Visit
Admission: EM | Admit: 2022-03-21 | Discharge: 2022-03-21 | Disposition: A | Discharge status: Home / Self Care | Payer: BC Managed Care – PPO | Attending: Nurse Practitioner | Admitting: Nurse Practitioner

## 2022-03-21 DIAGNOSIS — K921 Melena: Secondary | ICD-10-CM | POA: Insufficient documentation

## 2022-03-21 DIAGNOSIS — R1031 Right lower quadrant pain: Secondary | ICD-10-CM | POA: Diagnosis not present

## 2022-03-21 LAB — POCT URINE PREGNANCY: Preg Test, Ur: NEGATIVE

## 2022-03-21 LAB — POCT URINALYSIS DIP (MANUAL ENTRY)
Bilirubin, UA: NEGATIVE
Glucose, UA: NEGATIVE mg/dL
Ketones, POC UA: NEGATIVE mg/dL
Nitrite, UA: NEGATIVE
Protein Ur, POC: 30 mg/dL — AB
Spec Grav, UA: 1.015 (ref 1.010–1.025)
Urobilinogen, UA: 0.2 E.U./dL
pH, UA: 8.5 — AB (ref 5.0–8.0)

## 2022-03-21 LAB — POC HEMOCCULT BLD/STL (OFFICE/1-CARD/DIAGNOSTIC): Fecal Occult Blood, POC: NEGATIVE

## 2022-03-21 MED ORDER — ONDANSETRON 4 MG PO TBDP: 4.0000 mg | ORAL_TABLET | Freq: Three times a day (TID) | ORAL | 0 refills | Status: DC | PRN

## 2022-03-21 NOTE — ED Provider Notes (Signed)
[facility]    CSN: [id number] Arrival date & time: 03/21/22  0916      History   Chief Complaint Chief Complaint  Patient presents with   Abdominal Pain    HPI Victoria Key is a 21 y.o. female.   Patient presents today with complaints of right lower quadrant abdominal pain, subjective fever at home, and diarrhea with blood in her stool that started this morning.  She reports she started her menses yesterday, was wearing a tampon this morning so knows that the blood is not coming from her vagina.  Reports the pain is constant and cramping and worse right before she has a bowel movement.  Currently, she rates the pain as a 6/10.  She denies vomiting, however has been a little bit nauseous.  She has tolerated fluids however has been nervous to eat today.  Denies dysuria, urinary frequency, frequent NSAID use, new rash on her skin, indigestion, heartburn, and known hematuria although notes this could be possible.        Past Medical History:  Diagnosis Date   Abdominal pain, recurrent    Asthma    since a baby,ususally with anxiety    Bilateral pes planus    Depression    Diagnosed since 9th grade but feels like she has had it since 6th grade   Diarrhea    Headache(784.0)    Hypermobility syndrome    Diagnosed by [facility]    Migraine    Patellofemoral arthralgia of left knee    Scoliosis    Urinary tract infection     Patient Active Problem List   Diagnosis Date Noted   Encounter for surveillance of contraceptive pills 09/03/2020   Abdominal cramping 09/03/2020   RLQ abdominal pain 05/18/2020   Encounter for initial prescription of contraceptive pills 05/18/2020   Pregnancy examination or test, negative result 05/18/2020   Screening examination for STD (sexually transmitted disease) 10/10/2019   History of trichomoniasis 10/10/2019   Trichomonas infection 08/30/2019   Body mass index (BMI) of 30.0 to 30.9 in adult 06/29/2019   Slow transit  constipation 10/18/2018   Menstrual migraine without status migrainosus, not intractable 03/30/2018   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   Car sickness 06/11/2017   Chronic generalized pain 06/11/2017   GERD (gastroesophageal reflux disease) 11/07/2016   Adolescent depression 11/07/2016   Scoliosis deformity of spine 11/07/2016   Acne vulgaris 10/27/2016   Premenstrual dysphoric syndrome 10/27/2016   Dysmenorrhea in adolescent 10/27/2016   Anxiety state 12/16/2013   Migraine without aura and without status migrainosus, not intractable 10/17/2013   Tension headache 10/17/2013    Past Surgical History:  Procedure Laterality Date   KNEE ARTHROSCOPY WITH MEDIAL PATELLAR FEMORAL LIGAMENT RECONSTRUCTION Left 07/26/2018   Procedure: LEFT KNEE ARTHROSCOPY WITH MEDIAL PATELLAR FEMORAL LIGAMENT RECONSTRUCTION;  Surgeon: Bjorn Pippin, MD;  Location: [facility];  Service: Orthopedics;  Laterality: Left;   LABIOPLASTY Left 04/20/2018   Procedure: LEFT LABIAL REDUCTION;  Surgeon: [name], MD;  Location: [facility];  Service: Gynecology;  Laterality: Left;   URETERAL REIMPLANTION  2007   left-sided    OB History     Gravida  0   Para  0   Term  0   Preterm  0   AB  0   Living  0      SAB  0   IAB  0   Ectopic  0   Multiple  0  Live Births  0            Home Medications    Prior to Admission medications   Medication Sig Start Date End Date Taking? Authorizing Provider  ondansetron (ZOFRAN-ODT) 4 MG disintegrating tablet Take 1 tablet (4 mg total) by mouth every 8 (eight) hours as needed for nausea or vomiting. 03/21/22  Yes Cathlean Marseilles A, NP  ascorbic acid (VITAMIN C) 100 MG tablet Take by mouth.    [provider]  benzonatate (TESSALON) 100 MG capsule Take 1-2 capsules (100-200 mg total) by mouth 3 (three) times daily as needed for cough. 07/14/21   Wallis Bamberg, PA-C  carbamazepine (TEGRETOL) 200 MG tablet Take 3 tablets  (600 mg total) by mouth at bedtime. 12/03/20   Myrlene Broker, MD  cetirizine (ZYRTEC ALLERGY) 10 MG tablet Take 1 tablet (10 mg total) by mouth daily. 07/14/21   Wallis Bamberg, PA-C  Cholecalciferol (VITAMIN D3 PO) Take 10,000 Units by mouth at bedtime.    [provider]  clonazePAM (KLONOPIN) 0.5 MG tablet Take 1 tablet (0.5 mg total) by mouth daily as needed for anxiety. 11/12/20 11/12/21  Myrlene Broker, MD  FLUoxetine (PROZAC) 20 MG capsule Take 1 capsule (20 mg total) by mouth daily. 11/12/20   Myrlene Broker, MD  ibuprofen (ADVIL) 600 MG tablet Take 1 tablet (600 mg total) by mouth every 6 (six) hours as needed. 12/05/21   Wallis Bamberg, PA-C  ipratropium (ATROVENT) 0.03 % nasal spray Place 2 sprays into both nostrils 2 (two) times daily. 07/14/21   Wallis Bamberg, PA-C  lamoTRIgine (LAMICTAL) 100 MG tablet Take by mouth.    [provider]  melatonin 5 MG TABS Take 5 mg by mouth at bedtime.    [provider]  metroNIDAZOLE (FLAGYL) 500 MG tablet Take 1 tablet (500 mg total) by mouth 2 (two) times daily. 02/12/22   LampteyBritta Mccreedy, MD  norethindrone-ethinyl estradiol (LOESTRIN FE) 1-20 MG-MCG tablet Take 1 tablet by mouth daily. 09/03/20 09/03/21  Adline Potter, NP  nystatin cream (MYCOSTATIN) Apply 1 application topically 2 (two) times daily. Patient taking differently: Apply 1 application. topically. 08/03/19   Fredia Sorrow, NP  polyethylene glycol powder (GLYCOLAX/MIRALAX) powder Take 17 g by mouth daily. Patient taking differently: Take 17 g by mouth. 08/06/16   McDonell, Alfredia Client, MD  predniSONE (DELTASONE) 10 MG tablet Take 4 tablets (40 mg total) by mouth daily with breakfast. 07/17/21   Wallis Bamberg, PA-C  promethazine-dextromethorphan (PROMETHAZINE-DM) 6.25-15 MG/5ML syrup Take 5 mLs by mouth at bedtime as needed for cough. 07/14/21   Wallis Bamberg, PA-C  pseudoephedrine (SUDAFED) 60 MG tablet Take 1 tablet (60 mg total) by mouth every 8 (eight) hours as  needed for congestion. 07/14/21   Wallis Bamberg, PA-C  thyroid (NP THYROID) 30 MG tablet Take 1 tablet (30 mg total) by mouth daily before breakfast. 03/12/20   Wilson Singer, MD    Family History Family History  Problem Relation Age of Onset   Irritable bowel syndrome Brother    Asthma Brother    Ulcers Maternal Grandmother    Depression Maternal Grandmother    Cancer Maternal Grandmother    Hyperlipidemia Maternal Grandmother    Fibromyalgia Maternal Grandmother    Ulcers Paternal Grandfather    Hypertension Paternal Grandfather    Migraines Mother    Bipolar disorder Mother    Depression Mother    Anxiety disorder Mother    Fibromyalgia Mother  Alcohol abuse Maternal Grandfather    Depression Other    Depression Maternal Aunt     Social History Social History   Tobacco Use   Smoking status: Former    Types: Cigarettes, E-cigarettes    Quit date: 07/17/2019    Years since quitting: 2.6    Passive exposure: Never   Smokeless tobacco: Never   Tobacco comments:    mother vapes  Vaping Use   Vaping Use: Every day   Start date: 09/15/2017  Substance Use Topics   Alcohol use: Yes    Comment: once a month   Drug use: Yes    Types: Marijuana     Allergies   Latex, Penicillins, Amoxicillin-pot clavulanate, and Adhesive [tape]   Review of Systems Review of Systems Per HPI  Physical Exam Triage Vital Signs ED Triage Vitals  Enc Vitals Group     BP 03/21/22 1034 122/81     Pulse Rate 03/21/22 1034 64     Resp 03/21/22 1034 18     Temp 03/21/22 1034 98.8 F (37.1 C)     Temp Source 03/21/22 1034 Oral     SpO2 03/21/22 1034 98 %     Weight --      Height --      Head Circumference --      Peak Flow --      Pain Score 03/21/22 1035 4     Pain Loc --      Pain Edu? --      Excl. in GC? --    No data found.  Updated Vital Signs BP 122/81 (BP Location: Right Arm)   Pulse 64   Temp 98.8 F (37.1 C) (Oral)   Resp 18   LMP 03/20/2022 (Approximate)    SpO2 98%   Visual Acuity Right Eye Distance:   Left Eye Distance:   Bilateral Distance:    Right Eye Near:   Left Eye Near:    Bilateral Near:     Physical Exam Vitals and nursing note reviewed.  Constitutional:      General: She is not in acute distress.    Appearance: Normal appearance. She is not toxic-appearing.  HENT:     Head: Normocephalic and atraumatic.     Mouth/Throat:     Mouth: Mucous membranes are moist.     Pharynx: Oropharynx is clear.  Eyes:     General: No scleral icterus.    Extraocular Movements: Extraocular movements intact.  Cardiovascular:     Rate and Rhythm: Normal rate and regular rhythm.  Pulmonary:     Effort: Pulmonary effort is normal. No respiratory distress.     Breath sounds: Normal breath sounds. No wheezing, rhonchi or rales.  Abdominal:     General: Abdomen is flat. Bowel sounds are normal. There is no distension.     Palpations: Abdomen is soft.     Tenderness: There is abdominal tenderness in the right lower quadrant. There is no guarding. Negative signs include Murphy's sign and McBurney's sign.  Genitourinary:    Comments: Deferred Musculoskeletal:     Cervical back: Normal range of motion.  Lymphadenopathy:     Cervical: No cervical adenopathy.  Skin:    General: Skin is warm and dry.     Capillary Refill: Capillary refill takes less than 2 seconds.     Coloration: Skin is not jaundiced or pale.     Findings: No erythema.  Neurological:     Mental Status: She is alert and  oriented to person, place, and time.  Psychiatric:        Behavior: Behavior is cooperative.      UC Treatments / Results  Labs (all labs ordered are listed, but only abnormal results are displayed) Labs Reviewed  POCT URINALYSIS DIP (MANUAL ENTRY) - Abnormal; Notable for the following components:      Result Value   Color, UA other (*)    Blood, UA large (*)    pH, UA 8.5 (*)    Protein Ur, POC =30 (*)    Leukocytes, UA Large (3+) (*)    All  other components within normal limits  POC HEMOCCULT BLD/STL (OFFICE/1-CARD/DIAGNOSTIC) - Normal  URINE CULTURE  CBC WITH DIFFERENTIAL/PLATELET  POCT URINE PREGNANCY    EKG   Radiology No results found.  Procedures Procedures (including critical care time)  Medications Ordered in UC Medications - No data to display  Initial Impression / Assessment and Plan / UC Course  I have reviewed the triage vital signs and the nursing notes.  Pertinent labs & imaging results that were available during my care of the patient were reviewed by me and considered in my medical decision making (see chart for details).    Patient is a very pleasant, well-appearing 21 year old female presenting for right lower quadrant abdominal pain today.  On exam, her abdomen is tender to palpation, however she is not guarding.  Additionally, her vital signs are stable.  Hemoccult stool is negative today.  Urinalysis shows blood, leukocytes, and protein in her urine.  She does not have any burning with urination or classical urinary tract infection symptoms, however we will send for culture and treat if it comes back positive.  She is also on her menses which may explain the hematuria.  We discussed differentials including kidney stones, appendicitis, acute urinary tract infection, ovarian cysts, other intra-abdominal process.  I checked a CBC today to elevate her white blood cell count and recommended if the white blood cell count comes back elevated and if any of her symptoms are worsening over the weekend, she is go to the emergency room for further evaluation.  In the meantime, she can use Zofran 4 mg every 8 hours as needed for nausea/vomiting.  If any symptoms worsen over the weekend, she is go emergency room.  Note given for work.  The patient was given the opportunity to ask questions.  All questions answered to their satisfaction.  The patient is in agreement to this plan.   Final Clinical Impressions(s) / UC  Diagnoses   Final diagnoses:  RLQ abdominal pain  Blood in stool     Discharge Instructions      - The urinalysis today shows blood and some white blood cells.  We are sending your urine for a culture we will let you know if this comes back showing an acute infection that needs treatment early next week -The stool test today did not show blood in your stool -We have checked your blood counts today; if your white blood cell comes back elevated, please seek further care in the emergency room over the weekend -You can use Zofran 4 mg under your tongue every 8 hours as needed for nausea/vomiting - If any of your symptoms worsen and you develop high fever, nausea/vomiting and unable to keep fluids down, or you have severe pain, please go to the Emergency Room     ED Prescriptions     Medication Sig Dispense Auth. Provider   ondansetron (ZOFRAN-ODT) 4  MG disintegrating tablet Take 1 tablet (4 mg total) by mouth every 8 (eight) hours as needed for nausea or vomiting. 20 tablet Valentino Nose, NP      PDMP not reviewed this encounter.   Valentino Nose, NP 03/21/22 1302

## 2022-03-21 NOTE — ED Triage Notes (Signed)
Pt reports woke up this am with RLQ abd pain, diarrhea "orange in color with blood in it", nausea, intermittent chills. Pt denies any known fever.

## 2022-03-21 NOTE — Discharge Instructions (Addendum)
-   The urinalysis today shows blood and some white blood cells.  We are sending your urine for a culture we will let you know if this comes back showing an acute infection that needs treatment early next week -The stool test today did not show blood in your stool -We have checked your blood counts today; if your white blood cell comes back elevated, please seek further care in the emergency room over the weekend -You can use Zofran 4 mg under your tongue every 8 hours as needed for nausea/vomiting - If any of your symptoms worsen and you develop high fever, nausea/vomiting and unable to keep fluids down, or you have severe pain, please go to the Emergency Room

## 2022-03-21 NOTE — Telephone Encounter (Signed)
Chief Complaint: bloody diarrhea Symptoms: moderate right lower abdominal pain, lower back pain, nausea Frequency: this am Pertinent Negatives: Patient denies dizziness Disposition: [] ED /[x] Urgent Care (no appt availability in office) / [] Appointment(In office/virtual)/ []  [place] [facility]/ [] Home Care/ [] Refused Recommended Disposition /[] [place] [facility]/ []  Follow-up with PCP Additional Notes: pt has no PCP- advised pt to go to UC and to call back to try and get an appt for NP appt   Reason for Disposition  MODERATE rectal bleeding (small blood clots, passing blood without stool, or toilet water turns red)  Answer Assessment - Initial Assessment Questions 1. LOCATION: "Where does it hurt?"      Lower right side 2. RADIATION: "Does the pain shoot anywhere else?" (e.g., chest, back)     Lower back 3. ONSET: "When did the pain begin?" (e.g., minutes, hours or days ago)      This am 4. SUDDEN: "Gradual or sudden onset?"     sudden 5. PATTERN "Does the pain come and go, or is it constant?"    - If constant: "Is it getting better, staying the same, or worsening?"      (Note: Constant means the pain never goes away completely; most serious pain is constant and it progresses)     - If intermittent: "How long does it last?" "Do you have pain now?"     (Note: Intermittent means the pain goes away completely between bouts)     Comes and goes 6. SEVERITY: "How bad is the pain?"  (e.g., Scale 1-10; mild, moderate, or severe)   - MILD (1-3): doesn't interfere with normal activities, abdomen soft and not tender to touch    - MODERATE (4-7): interferes with normal activities or awakens from sleep, abdomen tender to touch    - SEVERE (8-10): excruciating pain, doubled over, unable to do any normal activities      moderate 7. RECURRENT SYMPTOM: "Have you ever had this type of stomach pain before?" If Yes, ask: "When was the last time?" and "What happened that time?"      no 8.  CAUSE: "What do you think is causing the stomach pain?"     unsure 9. RELIEVING/AGGRAVATING FACTORS: "What makes it better or worse?" (e.g., movement, antacids, bowel movement)     Not trying anything 10. OTHER SYMPTOMS: "Do you have any other symptoms?" (e.g., back pain, diarrhea, fever, urination pain, vomiting)       Bloody diarrhea- dark red mixed in stool- pink toilet water 11. PREGNANCY: "Is there any chance you are pregnant?" "When was your last menstrual period?"       *No Answer*  Answer Assessment - Initial Assessment Questions 1. APPEARANCE of BLOOD: "What color is it?" "Is it passed separately, on the surface of the stool, or mixed in with the stool?"      Dark red, mixed in stool 2. AMOUNT: "How much blood was passed?"      Enough to make the water pink 3. FREQUENCY: "How many times has blood been passed with the stools?"      2 times since this am 4. ONSET: "When was the blood first seen in the stools?" (Days or weeks)      This am 5. DIARRHEA: "Is there also some diarrhea?" If Yes, ask: "How many diarrhea stools in the past 24 hours?"      Diarrhea this am  6. CONSTIPATION: "Do you have constipation?" If Yes, ask: "How bad is it?"     no  7. RECURRENT SYMPTOMS: "Have you had blood in your stools before?" If Yes, ask: "When was the last time?" and "What happened that time?"      no 8. BLOOD THINNERS: "Do you take any blood thinners?" (e.g., Coumadin/warfarin, Pradaxa/dabigatran, aspirin)     no 9. OTHER SYMPTOMS: "Do you have any other symptoms?"  (e.g., abdomen pain, vomiting, dizziness, fever)     Abdominal pain, nausea 10. PREGNANCY: "Is there any chance you are pregnant?" "When was your last menstrual period?"       no  Protocols used: Abdominal Pain - Female-A-AH, Rectal Bleeding-A-AH

## 2022-03-22 LAB — CBC WITH DIFFERENTIAL/PLATELET
Basophils Absolute: 0 10*3/uL (ref 0.0–0.2)
Basos: 1 %
EOS (ABSOLUTE): 0.2 10*3/uL (ref 0.0–0.4)
Eos: 2 %
Hematocrit: 41.7 % (ref 34.0–46.6)
Hemoglobin: 14.4 g/dL (ref 11.1–15.9)
Immature Grans (Abs): 0 10*3/uL (ref 0.0–0.1)
Immature Granulocytes: 0 %
Lymphocytes Absolute: 1.3 10*3/uL (ref 0.7–3.1)
Lymphs: 19 %
MCH: 30.2 pg (ref 26.6–33.0)
MCHC: 34.5 g/dL (ref 31.5–35.7)
MCV: 87 fL (ref 79–97)
Monocytes Absolute: 0.6 10*3/uL (ref 0.1–0.9)
Monocytes: 8 %
Neutrophils Absolute: 5.1 10*3/uL (ref 1.4–7.0)
Neutrophils: 70 %
Platelets: 299 10*3/uL (ref 150–450)
RBC: 4.77 x10E6/uL (ref 3.77–5.28)
RDW: 12.1 % (ref 11.7–15.4)
WBC: 7.2 10*3/uL (ref 3.4–10.8)

## 2022-03-22 LAB — URINE CULTURE: Culture: <10000 — AB

## 2022-03-27 ENCOUNTER — Encounter (HOSPITAL_COMMUNITY): Payer: Self-pay | Admitting: Emergency Medicine

## 2022-03-27 ENCOUNTER — Emergency Department (HOSPITAL_COMMUNITY)
Admission: EM | Admit: 2022-03-27 | Discharge: 2022-03-27 | Disposition: A | Discharge status: Home / Self Care | Payer: BC Managed Care – PPO | Attending: Emergency Medicine | Admitting: Emergency Medicine

## 2022-03-27 ENCOUNTER — Other Ambulatory Visit: Payer: Self-pay

## 2022-03-27 DIAGNOSIS — J069 Acute upper respiratory infection, unspecified: Secondary | ICD-10-CM | POA: Insufficient documentation

## 2022-03-27 DIAGNOSIS — Z79899 Other long term (current) drug therapy: Secondary | ICD-10-CM | POA: Insufficient documentation

## 2022-03-27 DIAGNOSIS — H9201 Otalgia, right ear: Secondary | ICD-10-CM | POA: Diagnosis not present

## 2022-03-27 DIAGNOSIS — Z91040 Latex allergy status: Secondary | ICD-10-CM | POA: Insufficient documentation

## 2022-03-27 DIAGNOSIS — H6501 Acute serous otitis media, right ear: Secondary | ICD-10-CM | POA: Insufficient documentation

## 2022-03-27 MED ORDER — AZITHROMYCIN 250 MG PO TABS: 250.0000 mg | ORAL_TABLET | Freq: Every day | ORAL | 0 refills | Status: DC

## 2022-03-27 NOTE — ED Provider Notes (Signed)
[facility] EMERGENCY DEPARTMENT Provider Note   CSN: [id number] Arrival date & time: 03/27/22  0736     History  Chief Complaint  Patient presents with   Otalgia    Victoria Key is a 21 y.o. female.  She is here with nasal congestion for a few days and severe sharp stabbing right ear pain into her neck that started overnight.  No improvement with ibuprofen and eardrops.  No known fevers.  No sick contacts or recent travel.  She vapes.  The history is provided by the patient.  Otalgia Location:  Right Behind ear:  No abnormality Quality:  Shooting and sharp Severity:  Moderate Onset quality:  Sudden Duration:  1 day Timing:  Constant Progression:  Unchanged Chronicity:  New Context: recent URI   Relieved by:  Nothing Worsened by:  Nothing Ineffective treatments:  OTC medications Associated symptoms: congestion, neck pain and rhinorrhea   Associated symptoms: no cough, no ear discharge and no fever        Home Medications Prior to Admission medications   Medication Sig Start Date End Date Taking? Authorizing Provider  ascorbic acid (VITAMIN C) 100 MG tablet Take by mouth.    [provider]  benzonatate (TESSALON) 100 MG capsule Take 1-2 capsules (100-200 mg total) by mouth 3 (three) times daily as needed for cough. 07/14/21   Wallis Bamberg, PA-C  carbamazepine (TEGRETOL) 200 MG tablet Take 3 tablets (600 mg total) by mouth at bedtime. 12/03/20   Myrlene Broker, MD  cetirizine (ZYRTEC ALLERGY) 10 MG tablet Take 1 tablet (10 mg total) by mouth daily. 07/14/21   Wallis Bamberg, PA-C  Cholecalciferol (VITAMIN D3 PO) Take 10,000 Units by mouth at bedtime.    [provider]  clonazePAM (KLONOPIN) 0.5 MG tablet Take 1 tablet (0.5 mg total) by mouth daily as needed for anxiety. 11/12/20 11/12/21  Myrlene Broker, MD  FLUoxetine (PROZAC) 20 MG capsule Take 1 capsule (20 mg total) by mouth daily. 11/12/20   Myrlene Broker, MD  ibuprofen (ADVIL) 600 MG tablet Take  1 tablet (600 mg total) by mouth every 6 (six) hours as needed. 12/05/21   Wallis Bamberg, PA-C  ipratropium (ATROVENT) 0.03 % nasal spray Place 2 sprays into both nostrils 2 (two) times daily. 07/14/21   Wallis Bamberg, PA-C  lamoTRIgine (LAMICTAL) 100 MG tablet Take by mouth.    [provider]  melatonin 5 MG TABS Take 5 mg by mouth at bedtime.    [provider]  metroNIDAZOLE (FLAGYL) 500 MG tablet Take 1 tablet (500 mg total) by mouth 2 (two) times daily. 02/12/22   LampteyBritta Mccreedy, MD  norethindrone-ethinyl estradiol (LOESTRIN FE) 1-20 MG-MCG tablet Take 1 tablet by mouth daily. 09/03/20 09/03/21  Adline Potter, NP  nystatin cream (MYCOSTATIN) Apply 1 application topically 2 (two) times daily. Patient taking differently: Apply 1 application. topically. 08/03/19   Fredia Sorrow, NP  ondansetron (ZOFRAN-ODT) 4 MG disintegrating tablet Take 1 tablet (4 mg total) by mouth every 8 (eight) hours as needed for nausea or vomiting. 03/21/22   Valentino Nose, NP  polyethylene glycol powder (GLYCOLAX/MIRALAX) powder Take 17 g by mouth daily. Patient taking differently: Take 17 g by mouth. 08/06/16   McDonell, Alfredia Client, MD  predniSONE (DELTASONE) 10 MG tablet Take 4 tablets (40 mg total) by mouth daily with breakfast. 07/17/21   Wallis Bamberg, PA-C  promethazine-dextromethorphan (PROMETHAZINE-DM) 6.25-15 MG/5ML syrup Take 5 mLs by mouth at bedtime as needed  for cough. 07/14/21   Wallis Bamberg, PA-C  pseudoephedrine (SUDAFED) 60 MG tablet Take 1 tablet (60 mg total) by mouth every 8 (eight) hours as needed for congestion. 07/14/21   Wallis Bamberg, PA-C  thyroid (NP THYROID) 30 MG tablet Take 1 tablet (30 mg total) by mouth daily before breakfast. 03/12/20   Wilson Singer, MD      Allergies    Latex, Penicillins, Amoxicillin-pot clavulanate, and Adhesive [tape]    Review of Systems   Review of Systems  Constitutional:  Negative for fever.  HENT:  Positive for congestion, ear pain  and rhinorrhea. Negative for ear discharge.   Eyes:  Negative for visual disturbance.  Respiratory:  Negative for cough.   Musculoskeletal:  Positive for neck pain.    Physical Exam Updated Vital Signs BP 125/85 (BP Location: Left Arm)   Pulse 85   Temp 97.7 F (36.5 C) (Oral)   Resp 16   Ht 5\' 1"  (1.549 m)   Wt 70.3 kg   LMP 03/20/2022 (Approximate)   SpO2 100%   BMI 29.29 kg/m  Physical Exam Vitals and nursing note reviewed.  Constitutional:      General: She is not in acute distress.    Appearance: Normal appearance. She is well-developed.  HENT:     Head: Normocephalic and atraumatic.     Right Ear: Tympanic membrane is injected and erythematous.     Left Ear: Tympanic membrane and ear canal normal.     Nose: Rhinorrhea present.     Mouth/Throat:     Mouth: Mucous membranes are moist.     Pharynx: Oropharynx is clear. No oropharyngeal exudate or posterior oropharyngeal erythema.  Eyes:     Conjunctiva/sclera: Conjunctivae normal.  Cardiovascular:     Rate and Rhythm: Normal rate and regular rhythm.     Heart sounds: No murmur heard. Pulmonary:     Effort: Pulmonary effort is normal. No respiratory distress.     Breath sounds: Normal breath sounds.  Abdominal:     Palpations: Abdomen is soft.     Tenderness: There is no abdominal tenderness.  Musculoskeletal:        General: No swelling.     Cervical back: Neck supple.  Skin:    General: Skin is warm and dry.     Capillary Refill: Capillary refill takes less than 2 seconds.  Neurological:     Mental Status: She is alert.     ED Results / Procedures / Treatments   Labs (all labs ordered are listed, but only abnormal results are displayed) Labs Reviewed - No data to display  EKG None  Radiology No results found.  Procedures Procedures    Medications Ordered in ED Medications - No data to display  ED Course/ Medical Decision Making/ A&P                           Medical Decision  Making Risk Prescription drug management.   Differential diagnosis includes otitis media, otitis externa, serous effusion, allergic reaction, URI.        Final Clinical Impression(s) / ED Diagnoses Final diagnoses:  Right acute serous otitis media, recurrence not specified  Upper respiratory tract infection, unspecified type    Rx / DC Orders ED Discharge Orders          Ordered    azithromycin (ZITHROMAX) 250 MG tablet  Daily        03/27/22 0806  Terrilee Files, MD 03/27/22 [phone]

## 2022-03-27 NOTE — ED Triage Notes (Signed)
Woke up this morning with congestion and right ear pain radiating down to her throat. Patient reports headache x 3 days.

## 2022-04-03 ENCOUNTER — Ambulatory Visit
Admission: EM | Admit: 2022-04-03 | Discharge: 2022-04-03 | Disposition: A | Discharge status: Home / Self Care | Payer: BC Managed Care – PPO | Attending: Family Medicine | Admitting: Family Medicine

## 2022-04-03 ENCOUNTER — Ambulatory Visit
Admission: EM | Admit: 2022-04-03 | Discharge: 2022-04-03 | Discharge status: Absent without leave | Payer: BC Managed Care – PPO

## 2022-04-03 DIAGNOSIS — J3089 Other allergic rhinitis: Secondary | ICD-10-CM | POA: Diagnosis not present

## 2022-04-03 DIAGNOSIS — H65191 Other acute nonsuppurative otitis media, right ear: Secondary | ICD-10-CM

## 2022-04-03 MED ORDER — PREDNISONE 20 MG PO TABS: 40.0000 mg | ORAL_TABLET | Freq: Every day | ORAL | 0 refills | Status: DC

## 2022-04-03 MED ORDER — FLUTICASONE PROPIONATE 50 MCG/ACT NA SUSP: 1.0000 | Freq: Two times a day (BID) | NASAL | 2 refills | Status: DC

## 2022-04-03 NOTE — ED Triage Notes (Signed)
Pt presents with c/o r ear, neck and throat pain X 1 week. Pt states she was prescribed antibiotics, that have not gave relief. States she has been having strong headaches and states her ear still feels clogged. States she has more discomfort than pain and c/o echoing.

## 2022-04-03 NOTE — ED Provider Notes (Signed)
[facility]    CSN: [id number] Arrival date & time: 04/03/22  1237      History   Chief Complaint Chief Complaint  Patient presents with   Ear Pain    HPI Victoria Key is a 21 y.o. female.   Patient presenting today with ongoing right ear pain and pressure, throat soreness on the right for the past week.  She initially presented to the emergency department for this complaint at onset and was given azithromycin, takes allergy medications daily for seasonal allergies and states none of this has been helping tremendously.  Denies fever, chills, drainage from the ear, cough, chest pain, shortness of breath.    Past Medical History:  Diagnosis Date   Abdominal pain, recurrent    Asthma    since a baby,ususally with anxiety    Bilateral pes planus    Depression    Diagnosed since 9th grade but feels like she has had it since 6th grade   Diarrhea    Headache(784.0)    Hypermobility syndrome    Diagnosed by [facility]    Migraine    Patellofemoral arthralgia of left knee    Scoliosis    Urinary tract infection     Patient Active Problem List   Diagnosis Date Noted   Encounter for surveillance of contraceptive pills 09/03/2020   Abdominal cramping 09/03/2020   RLQ abdominal pain 05/18/2020   Encounter for initial prescription of contraceptive pills 05/18/2020   Pregnancy examination or test, negative result 05/18/2020   Screening examination for STD (sexually transmitted disease) 10/10/2019   History of trichomoniasis 10/10/2019   Trichomonas infection 08/30/2019   Body mass index (BMI) of 30.0 to 30.9 in adult 06/29/2019   Slow transit constipation 10/18/2018   Menstrual migraine without status migrainosus, not intractable 03/30/2018   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   Car sickness 06/11/2017   Chronic generalized pain 06/11/2017   GERD (gastroesophageal reflux disease) 11/07/2016   Adolescent depression 11/07/2016   Scoliosis  deformity of spine 11/07/2016   Acne vulgaris 10/27/2016   Premenstrual dysphoric syndrome 10/27/2016   Dysmenorrhea in adolescent 10/27/2016   Anxiety state 12/16/2013   Migraine without aura and without status migrainosus, not intractable 10/17/2013   Tension headache 10/17/2013    Past Surgical History:  Procedure Laterality Date   KNEE ARTHROSCOPY WITH MEDIAL PATELLAR FEMORAL LIGAMENT RECONSTRUCTION Left 07/26/2018   Procedure: LEFT KNEE ARTHROSCOPY WITH MEDIAL PATELLAR FEMORAL LIGAMENT RECONSTRUCTION;  Surgeon: Hiram Gash, MD;  Location: [facility];  Service: Orthopedics;  Laterality: Left;   LABIOPLASTY Left 04/20/2018   Procedure: LEFT LABIAL REDUCTION;  Surgeon: Jonnie Kind, MD;  Location: [facility];  Service: Gynecology;  Laterality: Left;   URETERAL REIMPLANTION  2007   left-sided    OB History     Gravida  0   Para  0   Term  0   Preterm  0   AB  0   Living  0      SAB  0   IAB  0   Ectopic  0   Multiple  0   Live Births  0            Home Medications    Prior to Admission medications   Medication Sig Start Date End Date Taking? Authorizing Provider  fluticasone (FLONASE) 50 MCG/ACT nasal spray Place 1 spray into both nostrils 2 (two) times daily. 04/03/22  Yes Volney American, PA-C  predniSONE (  DELTASONE) 20 MG tablet Take 2 tablets (40 mg total) by mouth daily with breakfast. 04/03/22  Yes Particia Nearing, PA-C  azithromycin (ZITHROMAX) 250 MG tablet Take 1 tablet (250 mg total) by mouth daily. Take first 2 tablets together, then 1 every day until finished. 03/27/22   Terrilee Files, MD  benzonatate (TESSALON) 100 MG capsule Take 1-2 capsules (100-200 mg total) by mouth 3 (three) times daily as needed for cough. 07/14/21   Wallis Bamberg, PA-C  carbamazepine (TEGRETOL) 200 MG tablet Take 3 tablets (600 mg total) by mouth at bedtime. 12/03/20   Myrlene Broker, MD  cetirizine (ZYRTEC ALLERGY) 10 MG tablet Take 1  tablet (10 mg total) by mouth daily. 07/14/21   Wallis Bamberg, PA-C  Cholecalciferol (VITAMIN D3 PO) Take 10,000 Units by mouth at bedtime.    [provider]  clonazePAM (KLONOPIN) 0.5 MG tablet Take 1 tablet (0.5 mg total) by mouth daily as needed for anxiety. 11/12/20 11/12/21  Myrlene Broker, MD  FLUoxetine (PROZAC) 20 MG capsule Take 1 capsule (20 mg total) by mouth daily. 11/12/20   Myrlene Broker, MD  ibuprofen (ADVIL) 600 MG tablet Take 1 tablet (600 mg total) by mouth every 6 (six) hours as needed. 12/05/21   Wallis Bamberg, PA-C  ipratropium (ATROVENT) 0.03 % nasal spray Place 2 sprays into both nostrils 2 (two) times daily. 07/14/21   Wallis Bamberg, PA-C  lamoTRIgine (LAMICTAL) 100 MG tablet Take by mouth.    [provider]  melatonin 5 MG TABS Take 5 mg by mouth at bedtime.    [provider]  metroNIDAZOLE (FLAGYL) 500 MG tablet Take 1 tablet (500 mg total) by mouth 2 (two) times daily. 02/12/22   LampteyBritta Mccreedy, MD  norethindrone-ethinyl estradiol (LOESTRIN FE) 1-20 MG-MCG tablet Take 1 tablet by mouth daily. 09/03/20 09/03/21  Adline Potter, NP  nystatin cream (MYCOSTATIN) Apply 1 application topically 2 (two) times daily. Patient taking differently: Apply 1 application. topically. 08/03/19   Fredia Sorrow, NP  ondansetron (ZOFRAN-ODT) 4 MG disintegrating tablet Take 1 tablet (4 mg total) by mouth every 8 (eight) hours as needed for nausea or vomiting. 03/21/22   Valentino Nose, NP  polyethylene glycol powder (GLYCOLAX/MIRALAX) powder Take 17 g by mouth daily. Patient taking differently: Take 17 g by mouth. 08/06/16   McDonell, Alfredia Client, MD  predniSONE (DELTASONE) 10 MG tablet Take 4 tablets (40 mg total) by mouth daily with breakfast. 07/17/21   Wallis Bamberg, PA-C  promethazine-dextromethorphan (PROMETHAZINE-DM) 6.25-15 MG/5ML syrup Take 5 mLs by mouth at bedtime as needed for cough. 07/14/21   Wallis Bamberg, PA-C  pseudoephedrine (SUDAFED) 60 MG tablet  Take 1 tablet (60 mg total) by mouth every 8 (eight) hours as needed for congestion. 07/14/21   Wallis Bamberg, PA-C  thyroid (NP THYROID) 30 MG tablet Take 1 tablet (30 mg total) by mouth daily before breakfast. Patient not taking: Reported on 03/27/2022 03/12/20   Wilson Singer, MD    Family History Family History  Problem Relation Age of Onset   Irritable bowel syndrome Brother    Asthma Brother    Ulcers Maternal Grandmother    Depression Maternal Grandmother    Cancer Maternal Grandmother    Hyperlipidemia Maternal Grandmother    Fibromyalgia Maternal Grandmother    Ulcers Paternal Grandfather    Hypertension Paternal Grandfather    Migraines Mother    Bipolar disorder Mother    Depression Mother    Anxiety  disorder Mother    Fibromyalgia Mother    Alcohol abuse Maternal Grandfather    Depression Other    Depression Maternal Aunt     Social History Social History   Tobacco Use   Smoking status: Former    Types: Cigarettes, E-cigarettes    Quit date: 07/17/2019    Years since quitting: 2.7    Passive exposure: Never   Smokeless tobacco: Never   Tobacco comments:    mother vapes  Vaping Use   Vaping Use: Every day   Start date: 09/15/2017  Substance Use Topics   Alcohol use: Yes    Comment: once a month   Drug use: Yes    Types: Marijuana     Allergies   Latex, Penicillins, Amoxicillin-pot clavulanate, and Adhesive [tape]   Review of Systems Review of Systems Per HPI  Physical Exam Triage Vital Signs ED Triage Vitals  Enc Vitals Group     BP 04/03/22 1402 111/75     Pulse Rate 04/03/22 1402 72     Resp 04/03/22 1402 19     Temp 04/03/22 1402 98.8 F (37.1 C)     Temp Source 04/03/22 1402 Oral     SpO2 04/03/22 1402 98 %     Weight --      Height --      Head Circumference --      Peak Flow --      Pain Score 04/03/22 1400 4     Pain Loc --      Pain Edu? --      Excl. in [place]? --    No data found.  Updated Vital Signs BP 111/75 (BP  Location: Right Arm)   Pulse 72   Temp 98.8 F (37.1 C) (Oral)   Resp 19   LMP 03/20/2022 (Approximate)   SpO2 98%   Visual Acuity Right Eye Distance:   Left Eye Distance:   Bilateral Distance:    Right Eye Near:   Left Eye Near:    Bilateral Near:     Physical Exam Vitals and nursing note reviewed.  Constitutional:      Appearance: Normal appearance. She is not ill-appearing.  HENT:     Head: Atraumatic.     Ears:     Comments: Right middle ear effusion, lesser middle ear effusion on the left    Nose: Nose normal.     Mouth/Throat:     Mouth: Mucous membranes are moist.     Pharynx: Oropharynx is clear.  Eyes:     Extraocular Movements: Extraocular movements intact.     Conjunctiva/sclera: Conjunctivae normal.  Cardiovascular:     Rate and Rhythm: Normal rate and regular rhythm.     Heart sounds: Normal heart sounds.  Pulmonary:     Effort: Pulmonary effort is normal.     Breath sounds: Normal breath sounds.  Musculoskeletal:        General: Normal range of motion.     Cervical back: Normal range of motion and neck supple.  Skin:    General: Skin is warm and dry.  Neurological:     Mental Status: She is alert and oriented to person, place, and time.  Psychiatric:        Mood and Affect: Mood normal.        Thought Content: Thought content normal.        Judgment: Judgment normal.      UC Treatments / Results  Labs (all labs ordered are listed,  but only abnormal results are displayed) Labs Reviewed - No data to display  EKG   Radiology No results found.  Procedures Procedures (including critical care time)  Medications Ordered in UC Medications - No data to display  Initial Impression / Assessment and Plan / UC Course  I have reviewed the triage vital signs and the nursing notes.  Pertinent labs & imaging results that were available during my care of the patient were reviewed by me and considered in my medical decision making (see chart for  details).     Treat with short burst of prednisone, Flonase, Sudafed, over-the-counter pain relievers.  Return for worsening symptoms.  Continue allergy regimen.  Final Clinical Impressions(s) / UC Diagnoses   Final diagnoses:  Acute middle ear effusion, right  Seasonal allergic rhinitis due to other allergic trigger   Discharge Instructions   None    ED Prescriptions     Medication Sig Dispense Auth. Provider   predniSONE (DELTASONE) 20 MG tablet Take 2 tablets (40 mg total) by mouth daily with breakfast. 6 tablet Particia Nearing, PA-C   fluticasone [facility]) 50 MCG/ACT nasal spray Place 1 spray into both nostrils 2 (two) times daily. 16 g Particia Nearing, [place]      PDMP not reviewed this encounter.   Particia Nearing, [place] 04/03/22 [phone]

## 2022-04-09 ENCOUNTER — Ambulatory Visit
Admission: RE | Admit: 2022-04-09 | Discharge: 2022-04-09 | Disposition: A | Discharge status: Home / Self Care | Payer: BC Managed Care – PPO | Source: Ambulatory Visit | Attending: Nurse Practitioner | Admitting: Nurse Practitioner

## 2022-04-09 VITALS — BP 122/79 | HR 87 | Temp 98.5°F | Resp 17

## 2022-04-09 DIAGNOSIS — H66001 Acute suppurative otitis media without spontaneous rupture of ear drum, right ear: Secondary | ICD-10-CM

## 2022-04-09 MED ORDER — CLINDAMYCIN HCL 150 MG PO CAPS: 450.0000 mg | ORAL_CAPSULE | Freq: Three times a day (TID) | ORAL | 0 refills | Status: AC

## 2022-04-09 NOTE — ED Provider Notes (Addendum)
[facility]    CSN: [id number] Arrival date & time: 04/09/22  1215      History   Chief Complaint Chief Complaint  Patient presents with   Ear Fullness    I have had an Ear Infection since 7/13, I had a round of antibiotics and steroids and my ear still hurts and is full of infection. - Entered by patient   Appointment    1230    HPI Victoria Key is a 21 y.o. female.   Patient presents with right ear pain that has not improved in the past 3 weeks.  She is also having some fullness in the left ear.  She denies ear drainage, fevers, cough, congestion, and sore throat.  Has been treated with azithromycin, prednisone, Sudafed, allergy medications without much relief.  Reports her hearing is decreased from the right ear.  She is wearing a cotton ball in the ear because it hurts whenever she does not.      Past Medical History:  Diagnosis Date   Abdominal pain, recurrent    Asthma    since a baby,ususally with anxiety    Bilateral pes planus    Depression    Diagnosed since 9th grade but feels like she has had it since 6th grade   Diarrhea    Headache(784.0)    Hypermobility syndrome    Diagnosed by [facility]    Migraine    Patellofemoral arthralgia of left knee    Scoliosis    Urinary tract infection     Patient Active Problem List   Diagnosis Date Noted   Encounter for surveillance of contraceptive pills 09/03/2020   Abdominal cramping 09/03/2020   RLQ abdominal pain 05/18/2020   Encounter for initial prescription of contraceptive pills 05/18/2020   Pregnancy examination or test, negative result 05/18/2020   Screening examination for STD (sexually transmitted disease) 10/10/2019   History of trichomoniasis 10/10/2019   Trichomonas infection 08/30/2019   Body mass index (BMI) of 30.0 to 30.9 in adult 06/29/2019   Slow transit constipation 10/18/2018   Menstrual migraine without status migrainosus, not intractable 03/30/2018   Obesity peds  (BMI >=95 percentile) 06/11/2017   Car sickness 06/11/2017   Chronic generalized pain 06/11/2017   GERD (gastroesophageal reflux disease) 11/07/2016   Adolescent depression 11/07/2016   Scoliosis deformity of spine 11/07/2016   Acne vulgaris 10/27/2016   Premenstrual dysphoric syndrome 10/27/2016   Dysmenorrhea in adolescent 10/27/2016   Anxiety state 12/16/2013   Migraine without aura and without status migrainosus, not intractable 10/17/2013   Tension headache 10/17/2013    Past Surgical History:  Procedure Laterality Date   KNEE ARTHROSCOPY WITH MEDIAL PATELLAR FEMORAL LIGAMENT RECONSTRUCTION Left 07/26/2018   Procedure: LEFT KNEE ARTHROSCOPY WITH MEDIAL PATELLAR FEMORAL LIGAMENT RECONSTRUCTION;  Surgeon: Bjorn Pippin, MD;  Location: [facility];  Service: Orthopedics;  Laterality: Left;   LABIOPLASTY Left 04/20/2018   Procedure: LEFT LABIAL REDUCTION;  Surgeon: [name], MD;  Location: [facility];  Service: Gynecology;  Laterality: Left;   URETERAL REIMPLANTION  2007   left-sided    OB History     Gravida  0   Para  0   Term  0   Preterm  0   AB  0   Living  0      SAB  0   IAB  0   Ectopic  0   Multiple  0   Live Births  0  Home Medications    Prior to Admission medications   Medication Sig Start Date End Date Taking? Authorizing Provider  clindamycin (CLEOCIN) 150 MG capsule Take 3 capsules (450 mg total) by mouth 3 (three) times daily for 7 days. 04/09/22 04/16/22 Yes Valentino Nose, NP  carbamazepine (TEGRETOL) 200 MG tablet Take 3 tablets (600 mg total) by mouth at bedtime. 12/03/20   Myrlene Broker, MD  cetirizine (ZYRTEC ALLERGY) 10 MG tablet Take 1 tablet (10 mg total) by mouth daily. 07/14/21   Wallis Bamberg, PA-C  Cholecalciferol (VITAMIN D3 PO) Take 10,000 Units by mouth at bedtime.    [provider]  clonazePAM (KLONOPIN) 0.5 MG tablet Take 1 tablet (0.5 mg total) by mouth daily as needed for anxiety.  11/12/20 11/12/21  Myrlene Broker, MD  FLUoxetine (PROZAC) 20 MG capsule Take 1 capsule (20 mg total) by mouth daily. 11/12/20   Myrlene Broker, MD  fluticasone [facility]) 50 MCG/ACT nasal spray Place 1 spray into both nostrils 2 (two) times daily. 04/03/22   Particia Nearing, PA-C  ibuprofen (ADVIL) 600 MG tablet Take 1 tablet (600 mg total) by mouth every 6 (six) hours as needed. 12/05/21   Wallis Bamberg, PA-C  ipratropium (ATROVENT) 0.03 % nasal spray Place 2 sprays into both nostrils 2 (two) times daily. 07/14/21   Wallis Bamberg, PA-C  lamoTRIgine (LAMICTAL) 100 MG tablet Take by mouth.    [provider]  melatonin 5 MG TABS Take 5 mg by mouth at bedtime.    [provider]  norethindrone-ethinyl estradiol (LOESTRIN FE) 1-20 MG-MCG tablet Take 1 tablet by mouth daily. 09/03/20 09/03/21  Adline Potter, NP  nystatin cream (MYCOSTATIN) Apply 1 application topically 2 (two) times daily. Patient taking differently: Apply 1 application. topically. 08/03/19   Fredia Sorrow, NP  ondansetron (ZOFRAN-ODT) 4 MG disintegrating tablet Take 1 tablet (4 mg total) by mouth every 8 (eight) hours as needed for nausea or vomiting. 03/21/22   Valentino Nose, NP  polyethylene glycol powder (GLYCOLAX/MIRALAX) powder Take 17 g by mouth daily. Patient taking differently: Take 17 g by mouth. 08/06/16   McDonell, Alfredia Client, MD  pseudoephedrine (SUDAFED) 60 MG tablet Take 1 tablet (60 mg total) by mouth every 8 (eight) hours as needed for congestion. 07/14/21   Wallis Bamberg, PA-C  thyroid (NP THYROID) 30 MG tablet Take 1 tablet (30 mg total) by mouth daily before breakfast. Patient not taking: Reported on 03/27/2022 03/12/20   Wilson Singer, MD    Family History Family History  Problem Relation Age of Onset   Irritable bowel syndrome Brother    Asthma Brother    Ulcers Maternal Grandmother    Depression Maternal Grandmother    Cancer Maternal Grandmother    Hyperlipidemia Maternal  Grandmother    Fibromyalgia Maternal Grandmother    Ulcers Paternal Grandfather    Hypertension Paternal Grandfather    Migraines Mother    Bipolar disorder Mother    Depression Mother    Anxiety disorder Mother    Fibromyalgia Mother    Alcohol abuse Maternal Grandfather    Depression Other    Depression Maternal Aunt     Social History Social History   Tobacco Use   Smoking status: Former    Types: Cigarettes, E-cigarettes    Quit date: 07/17/2019    Years since quitting: 2.7    Passive exposure: Never   Smokeless tobacco: Never   Tobacco comments:    mother vapes  Vaping Use   Vaping Use: Every day   Start date: 09/15/2017  Substance Use Topics   Alcohol use: Yes    Comment: once a month   Drug use: Yes    Types: Marijuana     Allergies   Latex, Penicillins, Amoxicillin-pot clavulanate, and Adhesive [tape]   Review of Systems Review of Systems Per HPI  Physical Exam Triage Vital Signs ED Triage Vitals [04/09/22 1226]  Enc Vitals Group     BP 122/79     Pulse Rate 87     Resp 17     Temp 98.5 F (36.9 C)     Temp Source Oral     SpO2 98 %     Weight      Height      Head Circumference      Peak Flow      Pain Score 5     Pain Loc      Pain Edu?      Excl. in GC?    No data found.  Updated Vital Signs BP 122/79 (BP Location: Right Arm)   Pulse 87   Temp 98.5 F (36.9 C) (Oral)   Resp 17   LMP 03/19/2022 (Approximate)   SpO2 98%   Visual Acuity Right Eye Distance:   Left Eye Distance:   Bilateral Distance:    Right Eye Near:   Left Eye Near:    Bilateral Near:     Physical Exam Vitals and nursing note reviewed.  Constitutional:      General: She is not in acute distress.    Appearance: Normal appearance. She is not toxic-appearing.  HENT:     Right Ear: Tympanic membrane is erythematous. Tympanic membrane is not perforated or bulging.     Left Ear: Tympanic membrane is not perforated, erythematous or bulging.     Nose: Nose  normal. No congestion.     Mouth/Throat:     Mouth: Mucous membranes are moist.     Pharynx: Oropharynx is clear. No oropharyngeal exudate.  Eyes:     General: No scleral icterus.    Extraocular Movements: Extraocular movements intact.  Pulmonary:     Effort: Pulmonary effort is normal. No respiratory distress.  Skin:    General: Skin is warm and dry.     Coloration: Skin is not jaundiced or pale.     Findings: No erythema or rash.  Neurological:     Mental Status: She is alert and oriented to person, place, and time.  Psychiatric:        Behavior: Behavior is cooperative.      UC Treatments / Results  Labs (all labs ordered are listed, but only abnormal results are displayed) Labs Reviewed - No data to display  EKG   Radiology No results found.  Procedures Procedures (including critical care time)  Medications Ordered in UC Medications - No data to display  Initial Impression / Assessment and Plan / UC Course  I have reviewed the triage vital signs and the nursing notes.  Pertinent labs & imaging results that were available during my care of the patient were reviewed by me and considered in my medical decision making (see chart for details).    Patient is a very pleasant, well-appearing 21 year old female presenting for right ear pain today.  On exam, the right tympanic membrane appears slightly erythematous.  We will treat for acute otitis media of right ear with clindamycin 450 mg three times daily for 7 days.  She is recently treated with azithromycin, has anaphylactic reaction to amoxicillin.  Recommended follow-up with ENT if no improvement with the clindamycin.  We discussed if she develops ear drainage, she can return to see [place]. Final Clinical Impressions(s) / UC Diagnoses   Final diagnoses:  Non-recurrent acute suppurative otitis media of right ear without spontaneous rupture of tympanic membrane     Discharge Instructions      - Please start the  clindamycin and take it as prescribed for ear infection in the right ear -Continue Tylenol/ibuprofen as needed for ear pain -Continue allergy regimen including Zyrtec and Flonase nasal spray to help treat allergies -Call Dr. Avel Sensor office to make an appointment if no improvement with this treatment    ED Prescriptions     Medication Sig Dispense Auth. Provider   clindamycin (CLEOCIN) 150 MG capsule Take 3 capsules (450 mg total) by mouth 3 (three) times daily for 7 days. 63 capsule Valentino Nose, NP      PDMP not reviewed this encounter.   Valentino Nose, NP 04/09/22 1257    Valentino Nose, NP 04/09/22 1258

## 2022-04-09 NOTE — ED Triage Notes (Signed)
Pt reports right era pain x 3 weeks. Reports she was prescribed antibiotics on 03/27/22 and steroids on 04/03/22 without relief.

## 2022-04-09 NOTE — Discharge Instructions (Addendum)
-   Please start the clindamycin and take it as prescribed for ear infection in the right ear -Continue Tylenol/ibuprofen as needed for ear pain -Continue allergy regimen including Zyrtec and Flonase nasal spray to help treat allergies -Call Dr. Avel Sensor office to make an appointment if no improvement with this treatment

## 2022-04-14 ENCOUNTER — Emergency Department (HOSPITAL_COMMUNITY)
Admission: EM | Admit: 2022-04-14 | Discharge: 2022-04-14 | Disposition: A | Discharge status: Home / Self Care | Payer: BC Managed Care – PPO | Attending: Emergency Medicine | Admitting: Emergency Medicine

## 2022-04-14 ENCOUNTER — Emergency Department (HOSPITAL_COMMUNITY): Payer: BC Managed Care – PPO

## 2022-04-14 ENCOUNTER — Other Ambulatory Visit: Payer: Self-pay

## 2022-04-14 ENCOUNTER — Encounter (HOSPITAL_COMMUNITY): Payer: Self-pay | Admitting: *Deleted

## 2022-04-14 DIAGNOSIS — R110 Nausea: Secondary | ICD-10-CM | POA: Diagnosis not present

## 2022-04-14 DIAGNOSIS — Z91040 Latex allergy status: Secondary | ICD-10-CM | POA: Insufficient documentation

## 2022-04-14 DIAGNOSIS — R102 Pelvic and perineal pain: Secondary | ICD-10-CM | POA: Insufficient documentation

## 2022-04-14 DIAGNOSIS — R1031 Right lower quadrant pain: Secondary | ICD-10-CM | POA: Diagnosis not present

## 2022-04-14 DIAGNOSIS — R197 Diarrhea, unspecified: Secondary | ICD-10-CM | POA: Diagnosis not present

## 2022-04-14 DIAGNOSIS — R300 Dysuria: Secondary | ICD-10-CM | POA: Diagnosis not present

## 2022-04-14 LAB — POC URINE PREG, ED: Preg Test, Ur: NEGATIVE

## 2022-04-14 LAB — CBC WITH DIFFERENTIAL/PLATELET
Abs Immature Granulocytes: 0.04 10*3/uL (ref 0.00–0.07)
Basophils Absolute: 0.1 10*3/uL (ref 0.0–0.1)
Basophils Relative: 1 %
Eosinophils Absolute: 0.1 10*3/uL (ref 0.0–0.5)
Eosinophils Relative: 1 %
HCT: 46.6 % — ABNORMAL HIGH (ref 36.0–46.0)
Hemoglobin: 15.3 g/dL — ABNORMAL HIGH (ref 12.0–15.0)
Immature Granulocytes: 0 %
Lymphocytes Relative: 14 %
Lymphs Abs: 1.6 10*3/uL (ref 0.7–4.0)
MCH: 28.9 pg (ref 26.0–34.0)
MCHC: 32.8 g/dL (ref 30.0–36.0)
MCV: 87.9 fL (ref 80.0–100.0)
Monocytes Absolute: 0.9 10*3/uL (ref 0.1–1.0)
Monocytes Relative: 8 %
Neutro Abs: 8.4 10*3/uL — ABNORMAL HIGH (ref 1.7–7.7)
Neutrophils Relative %: 76 %
Platelets: 407 10*3/uL — ABNORMAL HIGH (ref 150–400)
RBC: 5.3 MIL/uL — ABNORMAL HIGH (ref 3.87–5.11)
RDW: 12.7 % (ref 11.5–15.5)
WBC: 11 10*3/uL — ABNORMAL HIGH (ref 4.0–10.5)
nRBC: 0 % (ref 0.0–0.2)

## 2022-04-14 LAB — WET PREP, GENITAL
Clue Cells Wet Prep HPF POC: NONE SEEN
Sperm: NONE SEEN
Trich, Wet Prep: NONE SEEN
WBC, Wet Prep HPF POC: >10 — AB (ref <10)
Yeast Wet Prep HPF POC: NONE SEEN

## 2022-04-14 LAB — LIPASE, BLOOD: Lipase: 38 U/L (ref 11–51)

## 2022-04-14 LAB — COMPREHENSIVE METABOLIC PANEL
ALT: 18 U/L (ref 0–44)
AST: 13 U/L — ABNORMAL LOW (ref 15–41)
Albumin: 4.1 g/dL (ref 3.5–5.0)
Alkaline Phosphatase: 83 U/L (ref 38–126)
Anion gap: 6 (ref 5–15)
BUN: 12 mg/dL (ref 6–20)
CO2: 22 mmol/L (ref 22–32)
Calcium: 8.7 mg/dL — ABNORMAL LOW (ref 8.9–10.3)
Chloride: 108 mmol/L (ref 98–111)
Creatinine, Ser: 0.77 mg/dL (ref 0.44–1.00)
GFR, Estimated: >60 mL/min (ref >60)
Glucose, Bld: 104 mg/dL — ABNORMAL HIGH (ref 70–99)
Potassium: 4.1 mmol/L (ref 3.5–5.1)
Sodium: 136 mmol/L (ref 135–145)
Total Bilirubin: 0.5 mg/dL (ref 0.3–1.2)
Total Protein: 7.4 g/dL (ref 6.5–8.1)

## 2022-04-14 LAB — URINALYSIS, ROUTINE W REFLEX MICROSCOPIC
Bilirubin Urine: NEGATIVE
Glucose, UA: NEGATIVE mg/dL
Hgb urine dipstick: NEGATIVE
Ketones, ur: NEGATIVE mg/dL
Nitrite: NEGATIVE
Protein, ur: NEGATIVE mg/dL
Specific Gravity, Urine: 1.026 (ref 1.005–1.030)
pH: 5 (ref 5.0–8.0)

## 2022-04-14 MED ORDER — IBUPROFEN 800 MG PO TABS: 800.0000 mg | ORAL_TABLET | Freq: Three times a day (TID) | ORAL | 0 refills | Status: DC

## 2022-04-14 MED ORDER — IOHEXOL 300 MG/ML  SOLN
100.0000 mL | Freq: Once | INTRAMUSCULAR | Status: AC | PRN
  Administered 2022-04-14: 100 mL via INTRAVENOUS

## 2022-04-14 MED ORDER — KETOROLAC TROMETHAMINE 30 MG/ML IJ SOLN
30.0000 mg | Freq: Once | INTRAMUSCULAR | Status: AC
  Administered 2022-04-14: 30 mg via INTRAVENOUS
  Filled 2022-04-14: qty 1

## 2022-04-14 MED ORDER — ONDANSETRON HCL 4 MG/2ML IJ SOLN
4.0000 mg | Freq: Once | INTRAMUSCULAR | Status: AC
  Administered 2022-04-14: 4 mg via INTRAVENOUS
  Filled 2022-04-14: qty 2

## 2022-04-14 MED ORDER — MORPHINE SULFATE (PF) 4 MG/ML IV SOLN
4.0000 mg | Freq: Once | INTRAVENOUS | Status: AC
  Administered 2022-04-14: 4 mg via INTRAVENOUS
  Filled 2022-04-14: qty 1

## 2022-04-14 NOTE — ED Triage Notes (Signed)
Pt c/o right side abdominal pain that radiates around to her back; pain started x one day ago and states she has pain with urination

## 2022-04-14 NOTE — Discharge Instructions (Addendum)
Your right lower quadrant pain is likely related to a recently ruptured ovarian cyst.  Your pain should gradually improve.  I recommend that you apply warm heat on and off.  Rest, ibuprofen 3 times a day with food if needed for pain.  [facility]up with [facility].  Return to the emergency department for any new or worsening symptoms.

## 2022-04-14 NOTE — ED Provider Notes (Signed)
[facility] EMERGENCY DEPARTMENT Provider Note   CSN: [id number] Arrival date & time: 04/14/22  6295     History  Chief Complaint  Patient presents with   Abdominal Pain    Victoria Key is a 21 y.o. female.   Abdominal Pain Associated symptoms: diarrhea and nausea   Associated symptoms: no chest pain, no chills, no dysuria, no fever, no hematuria, no shortness of breath, no vaginal bleeding, no vaginal discharge and no vomiting        Victoria Key is a 21 y.o. female who presents to the Emergency Department complaining of right lower quadrant pain of gradual onset that began yesterday.  She initially thought she was having menstrual cramps.  States is not currently time for her menstrual cycle.  Pain intensified during the night.  She had difficulty sleeping due to pain.  This morning, pain has worsened and now radiating into her right back.  She describes the pain as sharp in quality.  Pain has been associated with 2 episodes of loose stools.  Nonbloody or black.  No dysuria symptoms recently, but states she had difficulty urinating this morning but believes it was due to her level of right lower quadrant pain.  She has had some nausea but no vomiting.  She denies any fever or chills.  No history of abnormal vaginal bleeding or discharge.  No new sexual partners.   Home Medications Prior to Admission medications   Medication Sig Start Date End Date Taking? Authorizing Provider  carbamazepine (TEGRETOL) 200 MG tablet Take 3 tablets (600 mg total) by mouth at bedtime. 12/03/20   Myrlene Broker, MD  cetirizine (ZYRTEC ALLERGY) 10 MG tablet Take 1 tablet (10 mg total) by mouth daily. 07/14/21   Wallis Bamberg, PA-C  Cholecalciferol (VITAMIN D3 PO) Take 10,000 Units by mouth at bedtime.    [provider]  clindamycin (CLEOCIN) 150 MG capsule Take 3 capsules (450 mg total) by mouth 3 (three) times daily for 7 days. 04/09/22 04/16/22  Valentino Nose, NP  clonazePAM  (KLONOPIN) 0.5 MG tablet Take 1 tablet (0.5 mg total) by mouth daily as needed for anxiety. 11/12/20 11/12/21  Myrlene Broker, MD  FLUoxetine (PROZAC) 20 MG capsule Take 1 capsule (20 mg total) by mouth daily. 11/12/20   Myrlene Broker, MD  fluticasone [facility]) 50 MCG/ACT nasal spray Place 1 spray into both nostrils 2 (two) times daily. 04/03/22   Particia Nearing, PA-C  ibuprofen (ADVIL) 600 MG tablet Take 1 tablet (600 mg total) by mouth every 6 (six) hours as needed. 12/05/21   Wallis Bamberg, PA-C  ipratropium (ATROVENT) 0.03 % nasal spray Place 2 sprays into both nostrils 2 (two) times daily. 07/14/21   Wallis Bamberg, PA-C  lamoTRIgine (LAMICTAL) 100 MG tablet Take by mouth.    [provider]  melatonin 5 MG TABS Take 5 mg by mouth at bedtime.    [provider]  norethindrone-ethinyl estradiol (LOESTRIN FE) 1-20 MG-MCG tablet Take 1 tablet by mouth daily. 09/03/20 09/03/21  Adline Potter, NP  nystatin cream (MYCOSTATIN) Apply 1 application topically 2 (two) times daily. Patient taking differently: Apply 1 application. topically. 08/03/19   Fredia Sorrow, NP  ondansetron (ZOFRAN-ODT) 4 MG disintegrating tablet Take 1 tablet (4 mg total) by mouth every 8 (eight) hours as needed for nausea or vomiting. 03/21/22   Valentino Nose, NP  polyethylene glycol powder (GLYCOLAX/MIRALAX) powder Take 17 g by mouth daily. Patient taking differently: Take  17 g by mouth. 08/06/16   McDonell, Alfredia Client, MD  pseudoephedrine (SUDAFED) 60 MG tablet Take 1 tablet (60 mg total) by mouth every 8 (eight) hours as needed for congestion. 07/14/21   Wallis Bamberg, PA-C  thyroid (NP THYROID) 30 MG tablet Take 1 tablet (30 mg total) by mouth daily before breakfast. Patient not taking: Reported on 03/27/2022 03/12/20   Wilson Singer, MD      Allergies    Latex, Penicillins, Amoxicillin-pot clavulanate, and Adhesive [tape]    Review of Systems   Review of Systems  Constitutional:  Negative  for appetite change, chills and fever.  HENT:  Negative for trouble swallowing.   Respiratory:  Negative for chest tightness and shortness of breath.   Cardiovascular:  Negative for chest pain.  Gastrointestinal:  Positive for abdominal pain, diarrhea and nausea. Negative for vomiting.  Genitourinary:  Positive for difficulty urinating. Negative for dysuria, flank pain, frequency, hematuria, menstrual problem, vaginal bleeding, vaginal discharge and vaginal pain.  Musculoskeletal:  Positive for back pain.  Skin:  Negative for rash.  Neurological:  Negative for weakness and headaches.    Physical Exam Updated Vital Signs BP 109/73 (BP Location: Left Arm)   Pulse 97   Temp 98.5 F (36.9 C) (Oral)   Resp 18   Ht 5\' 1"  (1.549 m)   Wt 70.3 kg   LMP 03/19/2022 (Approximate)   SpO2 99%   BMI 29.29 kg/m  Physical Exam Vitals and nursing note reviewed. Exam conducted with a chaperone present.  Constitutional:      Appearance: She is well-developed. She is not toxic-appearing.  Cardiovascular:     Rate and Rhythm: Normal rate and regular rhythm.  Pulmonary:     Effort: Pulmonary effort is normal. No respiratory distress.  Abdominal:     General: There is no distension.     Palpations: Abdomen is soft.     Tenderness: There is abdominal tenderness in the right lower quadrant. There is no right CVA tenderness, left CVA tenderness or guarding.  Genitourinary:    Vagina: No bleeding.     Cervix: Cervical motion tenderness present. No cervical bleeding.     Uterus: Tender. Not enlarged.      Adnexa:        Right: Tenderness present. No mass.         Left: No mass or tenderness.    Skin:    General: Skin is warm.     Capillary Refill: Capillary refill takes less than 2 seconds.     Findings: No rash.  Neurological:     General: No focal deficit present.     Mental Status: She is alert.     ED Results / Procedures / Treatments   Labs (all labs ordered are listed, but only  abnormal results are displayed) Labs Reviewed  WET PREP, GENITAL - Abnormal; Notable for the following components:      Result Value   WBC, Wet Prep HPF POC >10 (*)    All other components within normal limits  COMPREHENSIVE METABOLIC PANEL - Abnormal; Notable for the following components:   Glucose, Bld 104 (*)    Calcium 8.7 (*)    AST 13 (*)    All other components within normal limits  CBC WITH DIFFERENTIAL/PLATELET - Abnormal; Notable for the following components:   WBC 11.0 (*)    RBC 5.30 (*)    Hemoglobin 15.3 (*)    HCT 46.6 (*)    Platelets 407 (*)  Neutro Abs 8.4 (*)    All other components within normal limits  URINALYSIS, ROUTINE W REFLEX MICROSCOPIC - Abnormal; Notable for the following components:   APPearance HAZY (*)    Leukocytes,Ua TRACE (*)    Bacteria, UA RARE (*)    All other components within normal limits  URINE CULTURE  LIPASE, BLOOD  POC URINE PREG, ED  GC/CHLAMYDIA PROBE AMP ([place]) NOT AT [facility]    EKG None  Radiology US PELVIC COMPLETE W TRANSVAGINAL AND TORSION R/O  Result Date: 04/14/2022 CLINICAL DATA:  Acute pelvic pain. EXAM: TRANSABDOMINAL AND TRANSVAGINAL ULTRASOUND OF PELVIS DOPPLER ULTRASOUND OF OVARIES TECHNIQUE: Both transabdominal and transvaginal ultrasound examinations of the pelvis were performed. Transabdominal technique was performed for global imaging of the pelvis including uterus, ovaries, adnexal regions, and pelvic cul-de-sac. It was necessary to proceed with endovaginal exam following the transabdominal exam to visualize the endometrium and ovaries. Color and duplex Doppler ultrasound was utilized to evaluate blood flow to the ovaries. COMPARISON:  CT scan of same day.  Ultrasound of March 06, 2017. FINDINGS: Uterus Measurements: 6.9 x 4.9 x 2.7 cm = volume: 48 mL. No fibroids or other mass visualized. Endometrium Thickness: 9 mm which is within normal limits. No focal abnormality visualized. Right ovary Measurements: 3.1 x  2.3 x 1.9 cm = volume: 7 mL. 1.4 cm irregular cystic abnormality is noted most consistent with possible ruptured or involuting follicular cyst. Left ovary Measurements: 3.7 x 2.1 x 2.0 cm = volume: 9 mL. Normal appearance/no adnexal mass. Pulsed Doppler evaluation of both ovaries demonstrates normal low-resistance arterial and venous waveforms. Other findings Small amount of free fluid is noted which most likely is physiologic. IMPRESSION: 1.4 cm irregular cystic abnormality seen in right ovary which may represent ruptured or involuting follicle. There is no evidence of ovarian torsion. Electronically Signed   By: Lupita Raider M.D.   On: 04/14/2022 15:23   CT ABDOMEN PELVIS W CONTRAST  Result Date: 04/14/2022 CLINICAL DATA:  Right-sided pain starting 1 day ago.  Dysuria. EXAM: CT ABDOMEN AND PELVIS WITH CONTRAST TECHNIQUE: Multidetector CT imaging of the abdomen and pelvis was performed using the standard protocol following bolus administration of intravenous contrast. RADIATION DOSE REDUCTION: This exam was performed according to the departmental dose-optimization program which includes automated exposure control, adjustment of the mA and/or kV according to patient size and/or use of iterative reconstruction technique. CONTRAST:  OMNIPAQUE IOHEXOL 300 MG/ML  SOLN COMPARISON:  03/06/2017 pelvic ultrasound.  06/06/2011 CT. FINDINGS: Lower chest: Clear lung bases. Normal heart size without pericardial or pleural effusion. Hepatobiliary: Focal steatosis adjacent the falciform ligament. Normal gallbladder, without biliary ductal dilatation. Pancreas: Normal, without mass or ductal dilatation. Spleen: Normal in size, without focal abnormality. Adrenals/Urinary Tract: Normal adrenal glands. Again identified is left renal cortical scarring involving the upper and lower poles. Normal right kidney. No hydronephrosis. Normal urinary bladder. Stomach/Bowel: Normal stomach, without wall thickening. Colonic stool  burden suggests constipation. Normal terminal ileum. Normal appendix, including on 57/2. Normal small bowel. Vascular/Lymphatic: Normal caliber of the aorta and branch vessels. No abdominopelvic adenopathy. Reproductive: Possible arcuate type uterus, normal variant. Right ovarian corpus luteal cyst of 1.2 cm on 73/2. Other: Right-sided and cul-de-sac fluid is likely physiologic or may relate to recent cyst rupture. No abdominal ascites or free intraperitoneal air. Musculoskeletal: No acute osseous abnormality. Disc bulge at the lumbosacral junction is moderate. IMPRESSION: 1. Normal appendix. 2. Right ovarian corpus luteal cyst. Fluid within the pelvis could be  physiologic or relate to recent cyst rupture. 3.  Possible constipation. 4. Left renal scarring, similar. Electronically Signed   By: Jeronimo Greaves M.D.   On: 04/14/2022 11:42    Procedures Procedures    Medications Ordered in ED Medications  ondansetron (ZOFRAN) injection 4 mg (has no administration in time range)  morphine (PF) 4 MG/ML injection 4 mg (has no administration in time range)    ED Course/ Medical Decision Making/ A&P                           Medical Decision Making Patient here with gradually worsening right lower quadrant pain.  Pain began yesterday.  Worse this morning.  Associated with some diarrhea and nausea.  No fever or vomiting.  She denies any burning with urination or increased frequency.  Did have some pain associated with attempting to urinate this morning.  She does have history of recurrent abdominal pain, but states this pain feels different.  On exam, patient well-appearing nontoxic.  She does have some right lower quadrant pain on exam.  No guarding, there is some rebound tenderness.  No CVA tenderness on exam.  No midline tenderness of the mid to lower spine. At this point, differential diagnosis would include acute appendicitis, pelvic pain associated to GU process, pregnancy, STI.  Amount and/or  Complexity of Data Reviewed Labs: ordered.    Details: Labs interpreted by me, no evidence of leukocytosis, chemistries unremarkable.  Lipase also unremarkable, urinalysis with trace leukocytes and 6-10 WBC, there are rare bacteria and 21-50 squamous cells.  Patient does not have any significant dysuria symptoms and I feel this is secondary to contaminant.  Urine pregnancy test is negative, wet prep without evidence of clue cells or yeast.  GC and Chlamydia culture pending Radiology: ordered.    Details: CT abdomen pelvis was ordered for further evaluation of right lower quadrant pain.  CT shows normal appendix with right ovarian corpus luteal cyst, fluid within the pelvis could be physiologic or related to recent cyst rupture.  We will proceed with further evaluation with ultrasound of the pelvis to rule out torsion.  Ultrasound shows 1.4 cm irregular cystic abnormality of the right ovary likely to represent ruptured or involuting follicle no evidence of torsion Discussion of management or test interpretation with external provider(s): On recheck, patient resting comfortably.  Pain has improved since ER arrival.  Right lower quadrant pain felt to be related to recent ovarian cyst rupture.  Work-up today without evidence of torsion.  I feel that she is appropriate for discharge home, [facility]  She is agreeable to symptomatic treatment.  Return precautions were discussed  Risk Prescription drug management.           Final Clinical Impression(s) / ED Diagnoses Final diagnoses:  Pelvic pain    Rx / DC Orders ED Discharge Orders     None         Pauline Aus, PA-C 04/14/22 1550    Gloris Manchester, MD 04/15/22 [phone]

## 2022-04-16 LAB — GC/CHLAMYDIA PROBE AMP (~~LOC~~) NOT AT ARMC
Chlamydia: NEGATIVE
Comment: NEGATIVE
Comment: NORMAL
Neisseria Gonorrhea: NEGATIVE

## 2022-04-16 LAB — URINE CULTURE: Culture: NO GROWTH

## 2022-06-30 ENCOUNTER — Encounter: Payer: Self-pay | Admitting: Emergency Medicine

## 2022-06-30 ENCOUNTER — Ambulatory Visit
Admission: EM | Admit: 2022-06-30 | Discharge: 2022-06-30 | Disposition: A | Discharge status: Home / Self Care | Payer: BC Managed Care – PPO | Attending: Nurse Practitioner | Admitting: Nurse Practitioner

## 2022-06-30 DIAGNOSIS — Z8709 Personal history of other diseases of the respiratory system: Secondary | ICD-10-CM | POA: Insufficient documentation

## 2022-06-30 DIAGNOSIS — Z1152 Encounter for screening for COVID-19: Secondary | ICD-10-CM | POA: Insufficient documentation

## 2022-06-30 DIAGNOSIS — J069 Acute upper respiratory infection, unspecified: Secondary | ICD-10-CM | POA: Insufficient documentation

## 2022-06-30 MED ORDER — BENZONATATE 100 MG PO CAPS: 100.0000 mg | ORAL_CAPSULE | Freq: Three times a day (TID) | ORAL | 0 refills | Status: DC | PRN

## 2022-06-30 MED ORDER — ALBUTEROL SULFATE HFA 108 (90 BASE) MCG/ACT IN AERS: 1.0000 | INHALATION_SPRAY | Freq: Four times a day (QID) | RESPIRATORY_TRACT | 0 refills | Status: DC | PRN

## 2022-06-30 NOTE — ED Provider Notes (Signed)
[facility]    CSN: [id number] Arrival date & time: 06/30/22  1152      History   Chief Complaint Chief Complaint  Patient presents with   Cough   Sore Throat   Fever    HPI [place] is a 21 y.o. female.   Patient presents for 1 day of low-grade fever, dry cough, shortness of breath worse with activity, chest tightness and chest congestion, nasal congestion, runny nose, postnasal drainage, sneezing, sore throat, sinus pressure in her cheeks, headache, nausea, vomiting 1 time, and diarrhea yesterday.  Also reports decreased appetite and fatigue.  Patient denies wheezing, chest pain, ear pain or drainage, abdominal pain, loss of taste or smell, and new rash.  Reports she was recently exposed to her sister-in-law who had COVID-19.  Has been taking oral antihistamine, ibuprofen, Pepto-Bismol which seems to help with some of her symptoms.  Patient reports history of asthma and is out of her albuterol inhaler.  Reports she ran out a few weeks ago.  She is requesting refill today.  Last menstrual period 06/20/2022.    Past Medical History:  Diagnosis Date   Abdominal pain, recurrent    Asthma    since a baby,ususally with anxiety    Bilateral pes planus    Depression    Diagnosed since 9th grade but feels like she has had it since 6th grade   Diarrhea    Headache(784.0)    Hypermobility syndrome    Diagnosed by [facility]    Migraine    Patellofemoral arthralgia of left knee    Scoliosis    Urinary tract infection     Patient Active Problem List   Diagnosis Date Noted   Encounter for surveillance of contraceptive pills 09/03/2020   Abdominal cramping 09/03/2020   RLQ abdominal pain 05/18/2020   Encounter for initial prescription of contraceptive pills 05/18/2020   Pregnancy examination or test, negative result 05/18/2020   Screening examination for STD (sexually transmitted disease) 10/10/2019   History of trichomoniasis 10/10/2019    Trichomonas infection 08/30/2019   Body mass index (BMI) of 30.0 to 30.9 in adult 06/29/2019   Slow transit constipation 10/18/2018   Menstrual migraine without status migrainosus, not intractable 03/30/2018   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   Car sickness 06/11/2017   Chronic generalized pain 06/11/2017   GERD (gastroesophageal reflux disease) 11/07/2016   Adolescent depression 11/07/2016   Scoliosis deformity of spine 11/07/2016   Acne vulgaris 10/27/2016   Premenstrual dysphoric syndrome 10/27/2016   Dysmenorrhea in adolescent 10/27/2016   Anxiety state 12/16/2013   Migraine without aura and without status migrainosus, not intractable 10/17/2013   Tension headache 10/17/2013    Past Surgical History:  Procedure Laterality Date   KNEE ARTHROSCOPY WITH MEDIAL PATELLAR FEMORAL LIGAMENT RECONSTRUCTION Left 07/26/2018   Procedure: LEFT KNEE ARTHROSCOPY WITH MEDIAL PATELLAR FEMORAL LIGAMENT RECONSTRUCTION;  Surgeon: Bjorn Pippin, MD;  Location: [facility];  Service: Orthopedics;  Laterality: Left;   LABIOPLASTY Left 04/20/2018   Procedure: LEFT LABIAL REDUCTION;  Surgeon: [name], MD;  Location: [facility];  Service: Gynecology;  Laterality: Left;   URETERAL REIMPLANTION  2007   left-sided    OB History     Gravida  0   Para  0   Term  0   Preterm  0   AB  0   Living  0      SAB  0   IAB  0  Ectopic  0   Multiple  0   Live Births  0            Home Medications    Prior to Admission medications   Medication Sig Start Date End Date Taking? Authorizing Provider  albuterol (VENTOLIN HFA) 108 (90 Base) MCG/ACT inhaler Inhale 1-2 puffs into the lungs every 6 (six) hours as needed for wheezing or shortness of breath. 06/30/22  Yes Eulogio Bear, NP  benzonatate (TESSALON) 100 MG capsule Take 1 capsule (100 mg total) by mouth 3 (three) times daily as needed for cough. Do not take with alcohol or while driving or operating  heavy machinery.  May cause drowsiness. 06/30/22  Yes Eulogio Bear, NP  carbamazepine (TEGRETOL) 200 MG tablet Take 3 tablets (600 mg total) by mouth at bedtime. 12/03/20   Cloria Spring, MD  cetirizine (ZYRTEC ALLERGY) 10 MG tablet Take 1 tablet (10 mg total) by mouth daily. Patient not taking: Reported on 04/14/2022 07/14/21   Jaynee Eagles, PA-C  Cholecalciferol (VITAMIN D3 PO) Take 10,000 Units by mouth at bedtime.    [provider]  clonazePAM (KLONOPIN) 0.5 MG tablet Take 1 tablet (0.5 mg total) by mouth daily as needed for anxiety. 11/12/20 11/12/21  Cloria Spring, MD  FLUoxetine (PROZAC) 20 MG capsule Take 1 capsule (20 mg total) by mouth daily. 11/12/20   Cloria Spring, MD  fluticasone [facility]) 50 MCG/ACT nasal spray Place 1 spray into both nostrils 2 (two) times daily. 04/03/22   Volney American, PA-C  ibuprofen (ADVIL) 800 MG tablet Take 1 tablet (800 mg total) by mouth 3 (three) times daily. Take with food 04/14/22   Triplett, Tammy, PA-C  ipratropium (ATROVENT) 0.03 % nasal spray Place 2 sprays into both nostrils 2 (two) times daily. Patient not taking: Reported on 04/14/2022 07/14/21   Jaynee Eagles, PA-C  melatonin 5 MG TABS Take 5 mg by mouth at bedtime.    [provider]  norethindrone-ethinyl estradiol (LOESTRIN FE) 1-20 MG-MCG tablet Take 1 tablet by mouth daily. 09/03/20 09/03/21  Estill Dooms, NP  nystatin cream (MYCOSTATIN) Apply 1 application topically 2 (two) times daily. Patient not taking: Reported on 04/14/2022 08/03/19   Cletis Media, NP  ondansetron (ZOFRAN-ODT) 4 MG disintegrating tablet Take 1 tablet (4 mg total) by mouth every 8 (eight) hours as needed for nausea or vomiting. Patient not taking: Reported on 04/14/2022 03/21/22   Noemi Chapel A, NP  polyethylene glycol powder (GLYCOLAX/MIRALAX) powder Take 17 g by mouth daily. Patient not taking: Reported on 04/14/2022 08/06/16   McDonell, Kyra Manges, MD  pseudoephedrine  (SUDAFED) 60 MG tablet Take 1 tablet (60 mg total) by mouth every 8 (eight) hours as needed for congestion. Patient not taking: Reported on 04/14/2022 07/14/21   Jaynee Eagles, PA-C  thyroid (NP THYROID) 30 MG tablet Take 1 tablet (30 mg total) by mouth daily before breakfast. Patient not taking: Reported on 03/27/2022 03/12/20   Doree Albee, MD    Family History Family History  Problem Relation Age of Onset   Irritable bowel syndrome Brother    Asthma Brother    Ulcers Maternal Grandmother    Depression Maternal Grandmother    Cancer Maternal Grandmother    Hyperlipidemia Maternal Grandmother    Fibromyalgia Maternal Grandmother    Ulcers Paternal Grandfather    Hypertension Paternal Grandfather    Migraines Mother    Bipolar disorder Mother    Depression Mother  Anxiety disorder Mother    Fibromyalgia Mother    Alcohol abuse Maternal Grandfather    Depression Other    Depression Maternal Aunt     Social History Social History   Tobacco Use   Smoking status: Former    Types: Cigarettes, E-cigarettes    Quit date: 07/17/2019    Years since quitting: 2.9    Passive exposure: Never   Smokeless tobacco: Never   Tobacco comments:    mother vapes  Vaping Use   Vaping Use: Every day   Start date: 09/15/2017  Substance Use Topics   Alcohol use: Yes    Comment: once a month   Drug use: Yes    Types: Marijuana     Allergies   Latex, Penicillins, Amoxicillin-pot clavulanate, and Adhesive [tape]   Review of Systems Review of Systems Per HPI  Physical Exam Triage Vital Signs ED Triage Vitals  Enc Vitals Group     BP 06/30/22 1216 127/76     Pulse Rate 06/30/22 1216 75     Resp 06/30/22 1216 18     Temp 06/30/22 1216 98 F (36.7 C)     Temp Source 06/30/22 1216 Oral     SpO2 06/30/22 1216 96 %     Weight --      Height --      Head Circumference --      Peak Flow --      Pain Score 06/30/22 1215 0     Pain Loc --      Pain Edu? --      Excl. in GC? --     No data found.  Updated Vital Signs BP 127/76 (BP Location: Right Arm)   Pulse 75   Temp 98 F (36.7 C) (Oral)   Resp 18   SpO2 96%   Visual Acuity Right Eye Distance:   Left Eye Distance:   Bilateral Distance:    Right Eye Near:   Left Eye Near:    Bilateral Near:     Physical Exam Vitals and nursing note reviewed.  Constitutional:      General: She is not in acute distress.    Appearance: Normal appearance. She is not ill-appearing or toxic-appearing.  HENT:     Head: Normocephalic and atraumatic.     Right Ear: Tympanic membrane, ear canal and external ear normal. No drainage, swelling or tenderness. No middle ear effusion. Tympanic membrane is not erythematous.     Left Ear: Tympanic membrane, ear canal and external ear normal. No drainage, swelling or tenderness.  No middle ear effusion. Tympanic membrane is not erythematous.     Nose: Congestion present. No rhinorrhea.     Right Sinus: No maxillary sinus tenderness or frontal sinus tenderness.     Left Sinus: No maxillary sinus tenderness or frontal sinus tenderness.     Mouth/Throat:     Mouth: Mucous membranes are moist.     Pharynx: Oropharynx is clear. Posterior oropharyngeal erythema present. No pharyngeal swelling or oropharyngeal exudate.  Eyes:     General: No scleral icterus.    Extraocular Movements: Extraocular movements intact.  Cardiovascular:     Rate and Rhythm: Normal rate and regular rhythm.  Pulmonary:     Effort: Pulmonary effort is normal. No respiratory distress.     Breath sounds: Normal breath sounds. No wheezing, rhonchi or rales.  Abdominal:     General: Abdomen is flat. Bowel sounds are normal. There is no distension.  Palpations: Abdomen is soft.     Tenderness: There is no guarding.  Musculoskeletal:     Cervical back: Normal range of motion and neck supple.  Lymphadenopathy:     Cervical: No cervical adenopathy.  Skin:    General: Skin is warm and dry.     Coloration: Skin  is not jaundiced or pale.     Findings: No erythema or rash.  Neurological:     Mental Status: She is alert and oriented to person, place, and time.  Psychiatric:        Behavior: Behavior is cooperative.      UC Treatments / Results  Labs (all labs ordered are listed, but only abnormal results are displayed) Labs Reviewed  SARS CORONAVIRUS 2 (TAT 6-24 HRS)    EKG   Radiology No results found.  Procedures Procedures (including critical care time)  Medications Ordered in UC Medications - No data to display  Initial Impression / Assessment and Plan / UC Course  I have reviewed the triage vital signs and the nursing notes.  Pertinent labs & imaging results that were available during my care of the patient were reviewed by me and considered in my medical decision making (see chart for details).   Patient is well-appearing, normotensive, afebrile, not tachycardic, not tachypneic, oxygenating well on room air.    Encounter for screening for COVID-19 Viral URI with cough COVID-19 testing obtained Supportive care discussed Start cough suppressants as needed ER and return precautions discussed Note given for work  History of asthma Refill sent for albuterol inhaler  The patient was given the opportunity to ask questions.  All questions answered to their satisfaction.  The patient is in agreement to this plan.    Final Clinical Impressions(s) / UC Diagnoses   Final diagnoses:  Encounter for screening for COVID-19  Viral URI with cough  History of asthma     Discharge Instructions      You have a viral upper respiratory infection.  Symptoms should improve over the next week or so.  We have tested you for COVID-19 today. You will see the results in Mychart and we will call you with positive results.    Please stay home and isolate until you are aware of the results.    Some things that can make you feel better are: - Increased rest - Increasing fluid with  water/sugar free electrolytes - Acetaminophen and ibuprofen as needed for fever/pain.  - Salt water gargling, chloraseptic spray and throat lozenges - OTC guaifenesin (Mucinex) 600 mg twice daily.  - Saline sinus flushes or a neti pot.  - Humidifying the air. - Tessalon perles as needed for dry cough.  I have sent a refill of the albuterol for you to have on hand in case you need it.      ED Prescriptions     Medication Sig Dispense Auth. Provider   benzonatate (TESSALON) 100 MG capsule Take 1 capsule (100 mg total) by mouth 3 (three) times daily as needed for cough. Do not take with alcohol or while driving or operating heavy machinery.  May cause drowsiness. 21 capsule Cathlean Marseilles A, NP   albuterol (VENTOLIN HFA) 108 (90 Base) MCG/ACT inhaler Inhale 1-2 puffs into the lungs every 6 (six) hours as needed for wheezing or shortness of breath. 18 g Valentino Nose, NP      PDMP not reviewed this encounter.   Valentino Nose, NP 06/30/22 1520

## 2022-06-30 NOTE — Discharge Instructions (Addendum)
You have a viral upper respiratory infection.  Symptoms should improve over the next week or so.  We have tested you for COVID-19 today. You will see the results in Mychart and we will call you with positive results.    Please stay home and isolate until you are aware of the results.    Some things that can make you feel better are: - Increased rest - Increasing fluid with water/sugar free electrolytes - Acetaminophen and ibuprofen as needed for fever/pain.  - Salt water gargling, chloraseptic spray and throat lozenges - OTC guaifenesin (Mucinex) 600 mg twice daily.  - Saline sinus flushes or a neti pot.  - Humidifying the air. - Tessalon perles as needed for dry cough.  I have sent a refill of the albuterol for you to have on hand in case you need it.

## 2022-06-30 NOTE — ED Triage Notes (Signed)
Pt reports a cough, fever, sore throat and chest tightness when breathing. States she was recently exposure to Covid and started exhibiting symptoms lastnight

## 2022-07-01 LAB — SARS CORONAVIRUS 2 (TAT 6-24 HRS): SARS Coronavirus 2: NEGATIVE

## 2022-07-11 ENCOUNTER — Other Ambulatory Visit: Payer: Self-pay

## 2022-07-11 ENCOUNTER — Encounter: Payer: Self-pay | Admitting: Emergency Medicine

## 2022-07-11 ENCOUNTER — Ambulatory Visit
Admission: EM | Admit: 2022-07-11 | Discharge: 2022-07-11 | Disposition: A | Discharge status: Home / Self Care | Payer: BC Managed Care – PPO | Attending: Nurse Practitioner | Admitting: Nurse Practitioner

## 2022-07-11 DIAGNOSIS — J069 Acute upper respiratory infection, unspecified: Secondary | ICD-10-CM

## 2022-07-11 DIAGNOSIS — J45901 Unspecified asthma with (acute) exacerbation: Secondary | ICD-10-CM | POA: Diagnosis not present

## 2022-07-11 MED ORDER — MONTELUKAST SODIUM 10 MG PO TABS: 10.0000 mg | ORAL_TABLET | Freq: Every day | ORAL | 0 refills | Status: DC

## 2022-07-11 MED ORDER — FLUTICASONE PROPIONATE 50 MCG/ACT NA SUSP: 2.0000 | Freq: Every day | NASAL | 0 refills | Status: DC

## 2022-07-11 MED ORDER — PROMETHAZINE-DM 6.25-15 MG/5ML PO SYRP: 5.0000 mL | ORAL_SOLUTION | Freq: Four times a day (QID) | ORAL | 0 refills | Status: DC | PRN

## 2022-07-11 MED ORDER — PREDNISONE 20 MG PO TABS: 40.0000 mg | ORAL_TABLET | Freq: Every day | ORAL | 0 refills | Status: AC

## 2022-07-11 NOTE — ED Triage Notes (Addendum)
Pt reports cough, sore throat, chest congestion,generalized body aches since Wednesday. Reports decreased appetite.

## 2022-07-11 NOTE — ED Provider Notes (Signed)
[facility]    CSN: [id number] Arrival date & time: 07/11/22  1456      History   Chief Complaint Chief Complaint  Patient presents with   Cough    HPI [place] is a 21 y.o. female.   The history is provided by the patient.   Patient presents with a 2-day history of cough, nasal congestion, congestion, and generalized body aches.  She also states that she has had decreased appetite.  Patient has a history of asthma, states she has also been wheezing.  She denies fever, chills, shortness of breath, difficulty breathing, abdominal pain, nausea, vomiting, or diarrhea.  She denies any known sick contacts.  She has been using her albuterol inhaler for the cough.  Patient was seen in this clinic for the same or similar symptoms approximately 10 days ago.  She states that her symptoms did improve and eventually resolved that time.  Past Medical History:  Diagnosis Date   Abdominal pain, recurrent    Asthma    since a baby,ususally with anxiety    Bilateral pes planus    Depression    Diagnosed since 9th grade but feels like she has had it since 6th grade   Diarrhea    Headache(784.0)    Hypermobility syndrome    Diagnosed by [facility]    Migraine    Patellofemoral arthralgia of left knee    Scoliosis    Urinary tract infection     Patient Active Problem List   Diagnosis Date Noted   Encounter for surveillance of contraceptive pills 09/03/2020   Abdominal cramping 09/03/2020   RLQ abdominal pain 05/18/2020   Encounter for initial prescription of contraceptive pills 05/18/2020   Pregnancy examination or test, negative result 05/18/2020   Screening examination for STD (sexually transmitted disease) 10/10/2019   History of trichomoniasis 10/10/2019   Trichomonas infection 08/30/2019   Body mass index (BMI) of 30.0 to 30.9 in adult 06/29/2019   Slow transit constipation 10/18/2018   Menstrual migraine without status migrainosus, not intractable  03/30/2018   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   Car sickness 06/11/2017   Chronic generalized pain 06/11/2017   GERD (gastroesophageal reflux disease) 11/07/2016   Adolescent depression 11/07/2016   Scoliosis deformity of spine 11/07/2016   Acne vulgaris 10/27/2016   Premenstrual dysphoric syndrome 10/27/2016   Dysmenorrhea in adolescent 10/27/2016   Anxiety state 12/16/2013   Migraine without aura and without status migrainosus, not intractable 10/17/2013   Tension headache 10/17/2013    Past Surgical History:  Procedure Laterality Date   KNEE ARTHROSCOPY WITH MEDIAL PATELLAR FEMORAL LIGAMENT RECONSTRUCTION Left 07/26/2018   Procedure: LEFT KNEE ARTHROSCOPY WITH MEDIAL PATELLAR FEMORAL LIGAMENT RECONSTRUCTION;  Surgeon: Bjorn Pippin, MD;  Location: [facility];  Service: Orthopedics;  Laterality: Left;   LABIOPLASTY Left 04/20/2018   Procedure: LEFT LABIAL REDUCTION;  Surgeon: [name], MD;  Location: [facility];  Service: Gynecology;  Laterality: Left;   URETERAL REIMPLANTION  2007   left-sided    OB History     Gravida  0   Para  0   Term  0   Preterm  0   AB  0   Living  0      SAB  0   IAB  0   Ectopic  0   Multiple  0   Live Births  0            Home Medications  Prior to Admission medications   Medication Sig Start Date End Date Taking? Authorizing Provider  cetirizine (ZYRTEC ALLERGY) 10 MG tablet Take 1 tablet (10 mg total) by mouth daily. 07/14/21  Yes Wallis Bamberg, PA-C  fluticasone (FLONASE) 50 MCG/ACT nasal spray Place 2 sprays into both nostrils daily. 07/11/22  Yes Alder Murri-Warren, Sadie Haber, NP  montelukast (SINGULAIR) 10 MG tablet Take 1 tablet (10 mg total) by mouth at bedtime. 07/11/22  Yes Llewellyn Schoenberger-Warren, Sadie Haber, NP  predniSONE (DELTASONE) 20 MG tablet Take 2 tablets (40 mg total) by mouth daily with breakfast for 5 days. 07/11/22 07/16/22 Yes Ladana Chavero-Warren, Sadie Haber, NP  promethazine-dextromethorphan  (PROMETHAZINE-DM) 6.25-15 MG/5ML syrup Take 5 mLs by mouth 4 (four) times daily as needed for cough. 07/11/22  Yes Ashle Stief-Warren, Sadie Haber, NP  albuterol (VENTOLIN HFA) 108 (90 Base) MCG/ACT inhaler Inhale 1-2 puffs into the lungs every 6 (six) hours as needed for wheezing or shortness of breath. 06/30/22   Valentino Nose, NP  benzonatate (TESSALON) 100 MG capsule Take 1 capsule (100 mg total) by mouth 3 (three) times daily as needed for cough. Do not take with alcohol or while driving or operating heavy machinery.  May cause drowsiness. 06/30/22   Valentino Nose, NP  carbamazepine (TEGRETOL) 200 MG tablet Take 3 tablets (600 mg total) by mouth at bedtime. 12/03/20   Myrlene Broker, MD  Cholecalciferol (VITAMIN D3 PO) Take 10,000 Units by mouth at bedtime.    [provider]  clonazePAM (KLONOPIN) 0.5 MG tablet Take 1 tablet (0.5 mg total) by mouth daily as needed for anxiety. 11/12/20 11/12/21  Myrlene Broker, MD  FLUoxetine (PROZAC) 20 MG capsule Take 1 capsule (20 mg total) by mouth daily. 11/12/20   Myrlene Broker, MD  ibuprofen (ADVIL) 800 MG tablet Take 1 tablet (800 mg total) by mouth 3 (three) times daily. Take with food 04/14/22   Triplett, Tammy, PA-C  melatonin 5 MG TABS Take 5 mg by mouth at bedtime.    [provider]  nystatin cream (MYCOSTATIN) Apply 1 application topically 2 (two) times daily. Patient not taking: Reported on 04/14/2022 08/03/19   Fredia Sorrow, NP  ondansetron (ZOFRAN-ODT) 4 MG disintegrating tablet Take 1 tablet (4 mg total) by mouth every 8 (eight) hours as needed for nausea or vomiting. Patient not taking: Reported on 04/14/2022 03/21/22   Cathlean Marseilles A, NP  polyethylene glycol powder (GLYCOLAX/MIRALAX) powder Take 17 g by mouth daily. Patient not taking: Reported on 04/14/2022 08/06/16   McDonell, Alfredia Client, MD  pseudoephedrine (SUDAFED) 60 MG tablet Take 1 tablet (60 mg total) by mouth every 8 (eight) hours as needed for  congestion. Patient not taking: Reported on 04/14/2022 07/14/21   Wallis Bamberg, PA-C    Family History Family History  Problem Relation Age of Onset   Irritable bowel syndrome Brother    Asthma Brother    Ulcers Maternal Grandmother    Depression Maternal Grandmother    Cancer Maternal Grandmother    Hyperlipidemia Maternal Grandmother    Fibromyalgia Maternal Grandmother    Ulcers Paternal Grandfather    Hypertension Paternal Grandfather    Migraines Mother    Bipolar disorder Mother    Depression Mother    Anxiety disorder Mother    Fibromyalgia Mother    Alcohol abuse Maternal Grandfather    Depression Other    Depression Maternal Aunt     Social History Social History   Tobacco Use   Smoking status: Former  Types: Cigarettes, E-cigarettes    Quit date: 07/17/2019    Years since quitting: 2.9    Passive exposure: Never   Smokeless tobacco: Never   Tobacco comments:    mother vapes  Vaping Use   Vaping Use: Every day   Start date: 09/15/2017  Substance Use Topics   Alcohol use: Yes    Comment: once a month   Drug use: Yes    Types: Marijuana     Allergies   Latex, Penicillins, Amoxicillin-pot clavulanate, and Adhesive [tape]   Review of Systems Review of Systems Per HPI   Physical Exam Triage Vital Signs ED Triage Vitals [07/11/22 1551]  Enc Vitals Group     BP 117/81     Pulse Rate (!) 106     Resp 20     Temp 98.4 F (36.9 C)     Temp Source Oral     SpO2 98 %     Weight      Height      Head Circumference      Peak Flow      Pain Score 8     Pain Loc      Pain Edu?      Excl. in GC?    No data found.  Updated Vital Signs BP 117/81 (BP Location: Right Arm)   Pulse (!) 106   Temp 98.4 F (36.9 C) (Oral)   Resp 20   LMP 06/20/2022 (Approximate)   SpO2 98%   Visual Acuity Right Eye Distance:   Left Eye Distance:   Bilateral Distance:    Right Eye Near:   Left Eye Near:    Bilateral Near:     Physical Exam Vitals and  nursing note reviewed.  Constitutional:      General: She is not in acute distress.    Appearance: Normal appearance.  HENT:     Head: Normocephalic.     Right Ear: Tympanic membrane, ear canal and external ear normal.     Left Ear: Tympanic membrane, ear canal and external ear normal.     Nose: Congestion present. No rhinorrhea.     Right Turbinates: Enlarged and swollen.     Left Turbinates: Enlarged and swollen.     Right Sinus: No maxillary sinus tenderness or frontal sinus tenderness.     Left Sinus: No maxillary sinus tenderness or frontal sinus tenderness.     Mouth/Throat:     Lips: Pink.     Mouth: Mucous membranes are moist.     Pharynx: Uvula midline. Pharyngeal swelling and posterior oropharyngeal erythema present.     Tonsils: 1+ on the right. 1+ on the left.     Comments: Cobblestoning present to posterior tongue.  Eyes:     Extraocular Movements: Extraocular movements intact.     Conjunctiva/sclera: Conjunctivae normal.     Pupils: Pupils are equal, round, and reactive to light.  Cardiovascular:     Rate and Rhythm: Normal rate and regular rhythm.     Pulses: Normal pulses.     Heart sounds: Normal heart sounds.  Pulmonary:     Effort: Pulmonary effort is normal. No respiratory distress.     Breath sounds: No stridor. No wheezing, rhonchi or rales.  Abdominal:     General: Bowel sounds are normal.     Palpations: Abdomen is soft.     Tenderness: There is no abdominal tenderness.  Musculoskeletal:     Cervical back: Normal range of motion.  Lymphadenopathy:  Cervical: No cervical adenopathy.  Skin:    General: Skin is warm and dry.  Neurological:     General: No focal deficit present.     Mental Status: She is alert and oriented to person, place, and time.  Psychiatric:        Mood and Affect: Mood normal.        Behavior: Behavior normal.      UC Treatments / Results  Labs (all labs ordered are listed, but only abnormal results are  displayed) Labs Reviewed - No data to display  EKG   Radiology No results found.  Procedures Procedures (including critical care time)  Medications Ordered in UC Medications - No data to display  Initial Impression / Assessment and Plan / UC Course  I have reviewed the triage vital signs and the nursing notes.  Pertinent labs & imaging results that were available during my care of the patient were reviewed by me and considered in my medical decision making (see chart for details).  Patient presents with a 2-day history of upper respiratory symptoms including cough, nasal congestion, chest congestion, and generalized body aches.  On exam, patient's vital signs are stable, she is mildly tachycardic; however she is in no acute distress.  Lung sounds are clear throughout.  Patient declines COVID/flu testing today.  Based on this, symptoms are consistent with a viral upper respiratory infection with cough and asthma exacerbation.  For her asthma symptoms, will start patient on singular 10 mg and Promethazine DM cough syrup along with prednisone 40 mg twice daily for 5 days.  For her nasal congestion, will start patient on fluticasone nasal spray.  Supportive care and recommendations were provided to the patient to include use of over-the-counter analgesics for pain or discomfort, and the use of a humidifier for coughing.  Patient was advised of when to go to the emergency department, and when to follow-up in this clinic.  Work note was provided.  Patient verbalized understanding.  All questions were answered.  Patient stable for discharge. Final Clinical Impressions(s) / UC Diagnoses   Final diagnoses:  Viral upper respiratory tract infection with cough  Asthma with acute exacerbation, unspecified asthma severity, unspecified whether persistent     Discharge Instructions      Take medication as prescribed.  Continue your current allergy medications. Increase fluids and allow for plenty of  rest. Recommend Tylenol or ibuprofen as needed for pain, fever, or general discomfort. Recommend using a humidifier at bedtime during sleep to help with cough and nasal congestion. Sleep elevated on 2 pillows cough symptoms persist. Go to the emergency department if you have shortness of breath, difficulty breathing, inability to speak in a complete sentence, or other concerns.. Follow-up in this clinic or with your primary care physician if symptoms do not improve over the next 7 to 10 days.     ED Prescriptions     Medication Sig Dispense Auth. Provider   promethazine-dextromethorphan (PROMETHAZINE-DM) 6.25-15 MG/5ML syrup Take 5 mLs by mouth 4 (four) times daily as needed for cough. 118 mL Abisai Deer-Warren, Alda Lea, NP   montelukast (SINGULAIR) 10 MG tablet Take 1 tablet (10 mg total) by mouth at bedtime. 30 tablet Rea Kalama-Warren, Alda Lea, NP   fluticasone (FLONASE) 50 MCG/ACT nasal spray Place 2 sprays into both nostrils daily. 16 g Kynsli Haapala-Warren, Alda Lea, NP   predniSONE (DELTASONE) 20 MG tablet Take 2 tablets (40 mg total) by mouth daily with breakfast for 5 days. 10 tablet Zahriah Roes-Warren, Alda Lea, NP  PDMP not reviewed this encounter.   Abran Cantor, NP 07/11/22 [phone]

## 2022-07-11 NOTE — Discharge Instructions (Signed)
Take medication as prescribed.  Continue your current allergy medications. Increase fluids and allow for plenty of rest. Recommend Tylenol or ibuprofen as needed for pain, fever, or general discomfort. Recommend using a humidifier at bedtime during sleep to help with cough and nasal congestion. Sleep elevated on 2 pillows cough symptoms persist. Go to the emergency department if you have shortness of breath, difficulty breathing, inability to speak in a complete sentence, or other concerns.. Follow-up in this clinic or with your primary care physician if symptoms do not improve over the next 7 to 10 days.

## 2022-07-20 ENCOUNTER — Other Ambulatory Visit: Payer: Self-pay | Admitting: Nurse Practitioner

## 2022-07-21 NOTE — Telephone Encounter (Signed)
Unable to refill per protocol, last refill by another provider. Provider not at practice, will refuse.   Requested Prescriptions  Pending Prescriptions Disp Refills   albuterol (VENTOLIN HFA) 108 (90 Base) MCG/ACT inhaler [Pharmacy Med Name: ALBUTEROL HFA INH (200 PUFFS) 8.5GM] 8.5 g     Sig: INHALE 1 TO 2 PUFFS INTO THE LUNGS EVERY 6 HOURS AS NEEDED FOR WHEEZING OR SHORTNESS OF BREATH     Pulmonology:  Beta Agonists 2 Failed - 07/20/2022 10:26 AM      Failed - Valid encounter within last 12 months    Recent Outpatient Visits   None            Passed - Last BP in normal range    BP Readings from Last 1 Encounters:  07/11/22 117/81         Passed - Last Heart Rate in normal range    Pulse Readings from Last 1 Encounters:  07/11/22 (!) 106

## 2022-09-03 DIAGNOSIS — Z6828 Body mass index (BMI) 28.0-28.9, adult: Secondary | ICD-10-CM | POA: Diagnosis not present

## 2022-09-03 DIAGNOSIS — F418 Other specified anxiety disorders: Secondary | ICD-10-CM | POA: Diagnosis not present

## 2022-09-03 DIAGNOSIS — Z139 Encounter for screening, unspecified: Secondary | ICD-10-CM | POA: Diagnosis not present

## 2022-09-03 DIAGNOSIS — E663 Overweight: Secondary | ICD-10-CM | POA: Diagnosis not present

## 2022-09-03 DIAGNOSIS — Z0189 Encounter for other specified special examinations: Secondary | ICD-10-CM | POA: Diagnosis not present

## 2022-09-18 DIAGNOSIS — Z139 Encounter for screening, unspecified: Secondary | ICD-10-CM | POA: Diagnosis not present

## 2022-09-18 DIAGNOSIS — Z6828 Body mass index (BMI) 28.0-28.9, adult: Secondary | ICD-10-CM | POA: Diagnosis not present

## 2022-09-24 DIAGNOSIS — Z0001 Encounter for general adult medical examination with abnormal findings: Secondary | ICD-10-CM | POA: Diagnosis not present

## 2022-09-24 DIAGNOSIS — M6289 Other specified disorders of muscle: Secondary | ICD-10-CM | POA: Diagnosis not present

## 2022-09-24 DIAGNOSIS — Z0000 Encounter for general adult medical examination without abnormal findings: Secondary | ICD-10-CM | POA: Diagnosis not present

## 2022-09-24 DIAGNOSIS — F418 Other specified anxiety disorders: Secondary | ICD-10-CM | POA: Diagnosis not present

## 2022-09-24 DIAGNOSIS — R350 Frequency of micturition: Secondary | ICD-10-CM | POA: Diagnosis not present

## 2022-09-24 DIAGNOSIS — Z6828 Body mass index (BMI) 28.0-28.9, adult: Secondary | ICD-10-CM | POA: Diagnosis not present

## 2022-09-24 DIAGNOSIS — E663 Overweight: Secondary | ICD-10-CM | POA: Diagnosis not present

## 2022-09-24 DIAGNOSIS — Z6829 Body mass index (BMI) 29.0-29.9, adult: Secondary | ICD-10-CM | POA: Diagnosis not present

## 2022-11-05 DIAGNOSIS — J069 Acute upper respiratory infection, unspecified: Secondary | ICD-10-CM | POA: Diagnosis not present

## 2022-11-06 DIAGNOSIS — N390 Urinary tract infection, site not specified: Secondary | ICD-10-CM | POA: Diagnosis not present

## 2022-11-06 DIAGNOSIS — R300 Dysuria: Secondary | ICD-10-CM | POA: Diagnosis not present

## 2022-11-06 DIAGNOSIS — R1030 Lower abdominal pain, unspecified: Secondary | ICD-10-CM | POA: Diagnosis not present

## 2022-11-27 ENCOUNTER — Ambulatory Visit (INDEPENDENT_AMBULATORY_CARE_PROVIDER_SITE_OTHER): Payer: BC Managed Care – PPO

## 2022-11-27 ENCOUNTER — Ambulatory Visit
Admission: EM | Admit: 2022-11-27 | Discharge: 2022-11-27 | Disposition: A | Discharge status: Home / Self Care | Payer: BC Managed Care – PPO | Attending: Nurse Practitioner | Admitting: Nurse Practitioner

## 2022-11-27 ENCOUNTER — Encounter: Payer: Self-pay | Admitting: Emergency Medicine

## 2022-11-27 DIAGNOSIS — R059 Cough, unspecified: Secondary | ICD-10-CM | POA: Diagnosis not present

## 2022-11-27 DIAGNOSIS — J4521 Mild intermittent asthma with (acute) exacerbation: Secondary | ICD-10-CM | POA: Diagnosis not present

## 2022-11-27 DIAGNOSIS — R079 Chest pain, unspecified: Secondary | ICD-10-CM | POA: Diagnosis not present

## 2022-11-27 MED ORDER — PREDNISONE 20 MG PO TABS: 40.0000 mg | ORAL_TABLET | Freq: Every day | ORAL | 0 refills | Status: AC

## 2022-11-27 MED ORDER — METHYLPREDNISOLONE SODIUM SUCC 125 MG IJ SOLR
60.0000 mg | Freq: Once | INTRAMUSCULAR | Status: AC
  Administered 2022-11-27: 60 mg via INTRAMUSCULAR

## 2022-11-27 MED ORDER — ALBUTEROL SULFATE HFA 108 (90 BASE) MCG/ACT IN AERS: 1.0000 | INHALATION_SPRAY | Freq: Four times a day (QID) | RESPIRATORY_TRACT | 0 refills | Status: AC | PRN

## 2022-11-27 MED ORDER — IPRATROPIUM-ALBUTEROL 0.5-2.5 (3) MG/3ML IN SOLN
3.0000 mL | Freq: Once | RESPIRATORY_TRACT | Status: AC
  Administered 2022-11-27: 3 mL via RESPIRATORY_TRACT

## 2022-11-27 MED ORDER — BENZONATATE 100 MG PO CAPS: 100.0000 mg | ORAL_CAPSULE | Freq: Three times a day (TID) | ORAL | 0 refills | Status: DC | PRN

## 2022-11-27 NOTE — ED Provider Notes (Signed)
[facility]    CSN: [id number] Arrival date & time: 11/27/22  1458      History   Chief Complaint No chief complaint on file.   HPI Victoria Key is a 22 y.o. female.   Patient presents today with 3 week history of cough.  Also having congestion, body aches, congested cough, dry cough, shortness of breath, chest pain with coughing, wheezing, chest tightness, chest congestion, nasal congestion, runny nose, headache, nausea, and fatigue.  Patient denies fever, sore throat, chills, post nasal drainage, sinus pressure, ear pain, abdominal pain, vomiting, diarrhea, decreased appetite, loss of taste, loss of smell, and rash. Has taken albuterol with minimal improvement as well as Mucinex. No known sick contacts.    Past Medical History:  Diagnosis Date   Abdominal pain, recurrent    Asthma    since a baby,ususally with anxiety    Bilateral pes planus    Depression    Diagnosed since 9th grade but feels like she has had it since 6th grade   Diarrhea    Headache(784.0)    Hypermobility syndrome    Diagnosed by [facility]    Migraine    Patellofemoral arthralgia of left knee    Scoliosis    Urinary tract infection     Patient Active Problem List   Diagnosis Date Noted   Encounter for surveillance of contraceptive pills 09/03/2020   Abdominal cramping 09/03/2020   RLQ abdominal pain 05/18/2020   Encounter for initial prescription of contraceptive pills 05/18/2020   Pregnancy examination or test, negative result 05/18/2020   Screening examination for STD (sexually transmitted disease) 10/10/2019   History of trichomoniasis 10/10/2019   Trichomonas infection 08/30/2019   Body mass index (BMI) of 30.0 to 30.9 in adult 06/29/2019   Slow transit constipation 10/18/2018   Menstrual migraine without status migrainosus, not intractable 03/30/2018   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   Car sickness 06/11/2017   Chronic generalized pain 06/11/2017    GERD (gastroesophageal reflux disease) 11/07/2016   Adolescent depression 11/07/2016   Scoliosis deformity of spine 11/07/2016   Acne vulgaris 10/27/2016   Premenstrual dysphoric syndrome 10/27/2016   Dysmenorrhea in adolescent 10/27/2016   Anxiety state 12/16/2013   Migraine without aura and without status migrainosus, not intractable 10/17/2013   Tension headache 10/17/2013    Past Surgical History:  Procedure Laterality Date   KNEE ARTHROSCOPY WITH MEDIAL PATELLAR FEMORAL LIGAMENT RECONSTRUCTION Left 07/26/2018   Procedure: LEFT KNEE ARTHROSCOPY WITH MEDIAL PATELLAR FEMORAL LIGAMENT RECONSTRUCTION;  Surgeon: Hiram Gash, MD;  Location: [place];  Service: Orthopedics;  Laterality: Left;   LABIOPLASTY Left 04/20/2018   Procedure: LEFT LABIAL REDUCTION;  Surgeon: Jonnie Kind, MD;  Location: [facility];  Service: Gynecology;  Laterality: Left;   URETERAL REIMPLANTION  2007   left-sided    OB History     Gravida  0   Para  0   Term  0   Preterm  0   AB  0   Living  0      SAB  0   IAB  0   Ectopic  0   Multiple  0   Live Births  0            Home Medications    Prior to Admission medications   Medication Sig Start Date End Date Taking? Authorizing Provider  predniSONE (DELTASONE) 20 MG tablet Take 2 tablets (40 mg total) by mouth daily with breakfast  for 5 days. 11/27/22 12/02/22 Yes Eulogio Bear, NP  albuterol (VENTOLIN HFA) 108 (90 Base) MCG/ACT inhaler Inhale 1-2 puffs into the lungs every 6 (six) hours as needed for wheezing or shortness of breath. 11/27/22   Eulogio Bear, NP  benzonatate (TESSALON) 100 MG capsule Take 1 capsule (100 mg total) by mouth 3 (three) times daily as needed for cough. Do not take with alcohol or while driving or operating heavy machinery.  May cause drowsiness. 11/27/22   Eulogio Bear, NP  carbamazepine (TEGRETOL) 200 MG tablet Take 3 tablets (600 mg total) by mouth at bedtime. 12/03/20   Cloria Spring, MD  cetirizine (ZYRTEC ALLERGY) 10 MG tablet Take 1 tablet (10 mg total) by mouth daily. 07/14/21   Jaynee Eagles, PA-C  Cholecalciferol (VITAMIN D3 PO) Take 10,000 Units by mouth at bedtime.    [provider]  clonazePAM (KLONOPIN) 0.5 MG tablet Take 1 tablet (0.5 mg total) by mouth daily as needed for anxiety. 11/12/20 11/12/21  Cloria Spring, MD  FLUoxetine (PROZAC) 20 MG capsule Take 1 capsule (20 mg total) by mouth daily. 11/12/20   Cloria Spring, MD  fluticasone [employer]) 50 MCG/ACT nasal spray Place 2 sprays into both nostrils daily. 07/11/22   Leath-Warren, Alda Lea, NP  ibuprofen (ADVIL) 800 MG tablet Take 1 tablet (800 mg total) by mouth 3 (three) times daily. Take with food 04/14/22   Triplett, Tammy, PA-C  melatonin 5 MG TABS Take 5 mg by mouth at bedtime.    [provider]  montelukast (SINGULAIR) 10 MG tablet Take 1 tablet (10 mg total) by mouth at bedtime. 07/11/22   Leath-Warren, Alda Lea, NP  nystatin cream (MYCOSTATIN) Apply 1 application topically 2 (two) times daily. Patient not taking: Reported on 04/14/2022 08/03/19   Cletis Media, NP  polyethylene glycol powder (GLYCOLAX/MIRALAX) powder Take 17 g by mouth daily. Patient not taking: Reported on 04/14/2022 08/06/16   McDonell, Kyra Manges, MD    Family History Family History  Problem Relation Age of Onset   Irritable bowel syndrome Brother    Asthma Brother    Ulcers Maternal Grandmother    Depression Maternal Grandmother    Cancer Maternal Grandmother    Hyperlipidemia Maternal Grandmother    Fibromyalgia Maternal Grandmother    Ulcers Paternal Grandfather    Hypertension Paternal Grandfather    Migraines Mother    Bipolar disorder Mother    Depression Mother    Anxiety disorder Mother    Fibromyalgia Mother    Alcohol abuse Maternal Grandfather    Depression Other    Depression Maternal Aunt     Social History Social History   Tobacco Use   Smoking status: Former     Types: Cigarettes, E-cigarettes    Quit date: 07/17/2019    Years since quitting: 3.3    Passive exposure: Never   Smokeless tobacco: Never   Tobacco comments:    mother vapes  Vaping Use   Vaping Use: Every day   Start date: 09/15/2017   Substances: Nicotine, THC, Flavoring  Substance Use Topics   Alcohol use: Yes    Comment: once a month   Drug use: Yes    Types: Marijuana     Allergies   Latex, Penicillins, Amoxicillin-pot clavulanate, and Adhesive [tape]   Review of Systems Review of Systems Per HPI  Physical Exam Triage Vital Signs ED Triage Vitals  Enc Vitals Group     BP 11/27/22 1506 125/76  Pulse Rate 11/27/22 1506 (!) 122     Resp 11/27/22 1506 20     Temp 11/27/22 1506 99.3 F (37.4 C)     Temp Source 11/27/22 1506 Oral     SpO2 11/27/22 1506 96 %     Weight --      Height --      Head Circumference --      Peak Flow --      Pain Score 11/27/22 1507 6     Pain Loc --      Pain Edu? --      Excl. in [place]? --    No data found.  Updated Vital Signs BP 125/76 (BP Location: Right Arm)   Pulse (!) 115   Temp 99.3 F (37.4 C) (Oral)   Resp 20   LMP 11/16/2022 (Exact Date)   SpO2 99%   Visual Acuity Right Eye Distance:   Left Eye Distance:   Bilateral Distance:    Right Eye Near:   Left Eye Near:    Bilateral Near:     Physical Exam Vitals and nursing note reviewed.  Constitutional:      General: She is not in acute distress.    Appearance: Normal appearance. She is not ill-appearing or toxic-appearing.  HENT:     Head: Normocephalic and atraumatic.     Right Ear: Tympanic membrane, ear canal and external ear normal.     Left Ear: Tympanic membrane, ear canal and external ear normal.     Nose: No congestion or rhinorrhea.     Mouth/Throat:     Mouth: Mucous membranes are moist.     Pharynx: Oropharynx is clear. Posterior oropharyngeal erythema present. No oropharyngeal exudate.  Eyes:     General: No scleral icterus.    Extraocular  Movements: Extraocular movements intact.  Cardiovascular:     Rate and Rhythm: Normal rate and regular rhythm.  Pulmonary:     Effort: Pulmonary effort is normal. No respiratory distress.     Breath sounds: Wheezing present. No rhonchi or rales.  Abdominal:     General: Abdomen is flat. Bowel sounds are normal. There is no distension.     Palpations: Abdomen is soft.     Tenderness: There is no abdominal tenderness. There is no guarding.  Musculoskeletal:     Cervical back: Normal range of motion and neck supple.  Lymphadenopathy:     Cervical: No cervical adenopathy.  Skin:    General: Skin is warm and dry.     Coloration: Skin is not jaundiced or pale.     Findings: No erythema or rash.  Neurological:     Mental Status: She is alert and oriented to person, place, and time.  Psychiatric:        Behavior: Behavior is cooperative.      UC Treatments / Results  Labs (all labs ordered are listed, but only abnormal results are displayed) Labs Reviewed - No data to display  EKG   Radiology DG Chest 2 View  Result Date: 11/27/2022 CLINICAL DATA:  cough x 3 weeks; upper back pain EXAM: CHEST - 2 VIEW COMPARISON:  Radiograph 10/18/2014 FINDINGS: The heart size and mediastinal contours are within normal limits.No focal airspace disease. No pleural effusion or pneumothorax.No acute osseous abnormality. IMPRESSION: No evidence of acute cardiopulmonary disease. Electronically Signed   By: Maurine Simmering M.D.   On: 11/27/2022 15:47    Procedures Procedures (including critical care time)  Medications Ordered in UC Medications  ipratropium-albuterol (DUONEB) 0.5-2.5 (3) MG/3ML nebulizer solution 3 mL (3 mLs Nebulization Given 11/27/22 1524)  methylPREDNISolone sodium succinate (SOLU-MEDROL) 125 mg/2 mL injection 60 mg (60 mg Intramuscular Given 11/27/22 1523)    Initial Impression / Assessment and Plan / UC Course  I have reviewed the triage vital signs and the nursing  notes.  Pertinent labs & imaging results that were available during my care of the patient were reviewed by me and considered in my medical decision making (see chart for details).   Patient is well-appearing, normotensive, afebrile, not tachycardic, not tachypneic, oxygenating well on room air.    1. Mild intermittent asthma with acute exacerbation Vital signs and examination today are reassuring Chest x-ray today is negative for acute cardiopulmonary process DuoNeb given in urgent care as well as Solu-Medrol 60 mg IM Vital signs improved afterward and lung sounds clear to auscultation Start oral prednisone tomorrow Other supportive care discussed Start Tessalon Perles ER and return precautions discussed Note given for work  The patient was given the opportunity to ask questions.  All questions answered to their satisfaction.  The patient is in agreement to this plan.   Final Clinical Impressions(s) / UC Diagnoses   Final diagnoses:  Mild intermittent asthma with acute exacerbation     Discharge Instructions      The chest x-ray today does not show pneumonia Please continue to use the rescue inhaler every 4-6 hours as needed for cough, wheezing or shortness of breath We gave you a breathing treatment and steroid shot today Start the oral prednisone tomorrow You can use the Tessalon perles as needed for the cough Follow up if symptoms do not continue to improve or if they worsen     ED Prescriptions     Medication Sig Dispense Auth. Provider   albuterol (VENTOLIN HFA) 108 (90 Base) MCG/ACT inhaler Inhale 1-2 puffs into the lungs every 6 (six) hours as needed for wheezing or shortness of breath. 18 g Noemi Chapel A, NP   benzonatate (TESSALON) 100 MG capsule Take 1 capsule (100 mg total) by mouth 3 (three) times daily as needed for cough. Do not take with alcohol or while driving or operating heavy machinery.  May cause drowsiness. 21 capsule Noemi Chapel A, NP    predniSONE (DELTASONE) 20 MG tablet Take 2 tablets (40 mg total) by mouth daily with breakfast for 5 days. 10 tablet Eulogio Bear, NP      PDMP not reviewed this encounter.   Eulogio Bear, NP 11/27/22 1735

## 2022-11-27 NOTE — ED Triage Notes (Signed)
Was seen 3 weeks ago for cough and was put on delsym.  States cough has gotten worse.  Cough productive at times with greenish color sputum.  State chest and back hurt from coughing.

## 2022-11-27 NOTE — Discharge Instructions (Addendum)
The chest x-ray today does not show pneumonia Please continue to use the rescue inhaler every 4-6 hours as needed for cough, wheezing or shortness of breath We gave you a breathing treatment and steroid shot today Start the oral prednisone tomorrow You can use the Tessalon perles as needed for the cough Follow up if symptoms do not continue to improve or if they worsen

## 2023-06-16 DIAGNOSIS — K580 Irritable bowel syndrome with diarrhea: Secondary | ICD-10-CM | POA: Diagnosis not present

## 2023-06-16 DIAGNOSIS — R109 Unspecified abdominal pain: Secondary | ICD-10-CM | POA: Diagnosis not present

## 2023-07-13 DIAGNOSIS — N939 Abnormal uterine and vaginal bleeding, unspecified: Secondary | ICD-10-CM | POA: Diagnosis not present

## 2023-10-22 ENCOUNTER — Ambulatory Visit (HOSPITAL_COMMUNITY)
Admission: RE | Admit: 2023-10-22 | Discharge: 2023-10-22 | Disposition: A | Discharge status: Home / Self Care | Payer: BC Managed Care – PPO | Source: Ambulatory Visit | Attending: Family Medicine | Admitting: Family Medicine

## 2023-10-22 ENCOUNTER — Other Ambulatory Visit (HOSPITAL_COMMUNITY): Payer: Self-pay | Admitting: Family Medicine

## 2023-10-22 DIAGNOSIS — R1032 Left lower quadrant pain: Secondary | ICD-10-CM | POA: Diagnosis not present

## 2023-10-22 DIAGNOSIS — N939 Abnormal uterine and vaginal bleeding, unspecified: Secondary | ICD-10-CM | POA: Diagnosis not present

## 2023-10-28 ENCOUNTER — Encounter: Payer: Self-pay | Admitting: Adult Health

## 2023-10-28 ENCOUNTER — Ambulatory Visit: Payer: BC Managed Care – PPO | Admitting: Adult Health

## 2023-10-28 ENCOUNTER — Other Ambulatory Visit (HOSPITAL_COMMUNITY)
Admission: RE | Admit: 2023-10-28 | Discharge: 2023-10-28 | Disposition: A | Discharge status: Home / Self Care | Payer: BC Managed Care – PPO | Source: Ambulatory Visit | Attending: Adult Health | Admitting: Adult Health

## 2023-10-28 VITALS — BP 127/89 | HR 110 | Ht 61.5 in | Wt 136.0 lb

## 2023-10-28 DIAGNOSIS — Z8742 Personal history of other diseases of the female genital tract: Secondary | ICD-10-CM | POA: Diagnosis not present

## 2023-10-28 DIAGNOSIS — Z113 Encounter for screening for infections with a predominantly sexual mode of transmission: Secondary | ICD-10-CM | POA: Insufficient documentation

## 2023-10-28 DIAGNOSIS — Z124 Encounter for screening for malignant neoplasm of cervix: Secondary | ICD-10-CM | POA: Insufficient documentation

## 2023-10-28 DIAGNOSIS — R102 Pelvic and perineal pain unspecified side: Secondary | ICD-10-CM | POA: Insufficient documentation

## 2023-10-28 NOTE — Progress Notes (Signed)
  Subjective:     Patient ID: Victoria Key, female   DOB: 2001-06-25, 23 y.o.   MRN: [id number]  HPI Victoria Key is a 23 year old white female, with SO, G0P0, in complaining of having pelvic pain that goes in to back and comes and goes. She said last Tuesday had pain, nausea and diarrhea and bleeding for about 2 days, and had fever, thinks ovarian cyst ruptured has had that in the past.  She did see PCP and told to come here. And she needs a pap. Had normal labs with PCP.   PCP is Dr Margo Aye  Review of Systems +pelvic pain that goes in to back and comes and goes.  +last Tuesday had pain, nausea and diarrhea and bleeding for about 2 days, and had fever, thinks ovarian cyst ruptured has had that in the past Periods normal Reviewed past medical,surgical, social and family history. Reviewed medications and allergies.     Objective:   Physical Exam BP 127/89 (BP Location: Left Arm, Patient Position: Sitting, Cuff Size: Normal)   Pulse (!) 110   Ht 5' 1.5" (1.562 m)   Wt 136 lb (61.7 kg)   LMP 10/05/2023 (Exact Date)   BMI 25.28 kg/m     Skin warm and dry.Pelvic: external genitalia is normal in appearance no lesions, vagina:pink,urethra has no lesions or masses noted, cervix is everted at os, and nulliparous, pap with GC/CHL performed, uterus: normal size, shape and contour, non tender, no masses felt, adnexa: no masses, mild tenderness noted, on right. Bladder is non tender and no masses felt.  Fall risk is low  Up[facility] - 10/28/23 1413       Pregnancy Intention Screening   Does the patient want to become pregnant in the next year? No    Does the patient's partner want to become pregnant in the next year? No    Would the patient like to discuss contraceptive options today? No      Contraception Wrap Up   Current Method No Method - Other Reason    End Method Female Condom            Examination chaperoned by Malachy Mood LPN  Assessment:     1. Routine Papanicolaou smear Pap  sent Pap in 3 years if normal - Cytology - PAP( [place])  2. Pelvic pain (Primary) Had pain last Tuesday with nausea,diarrhea and fever and bleeding for 2 days Has pelvic pain that goes in to back and comes and goes, now Will get [place] in about 2 weeks after period to assess uterus and ovaries  Take 2 ES tylenol and 3 ibuprofen every 6-8 hours prn pain  Push fluids  - US PELVIC COMPLETE WITH TRANSVAGINAL; Future  3. History of ovarian cyst Will get [place] to assess - US PELVIC COMPLETE WITH TRANSVAGINAL; Future     Plan:     Return in about 2 weeks for pelvic US  Will talk when results back

## 2023-11-03 ENCOUNTER — Encounter: Payer: Self-pay | Admitting: Adult Health

## 2023-11-03 DIAGNOSIS — R87619 Unspecified abnormal cytological findings in specimens from cervix uteri: Secondary | ICD-10-CM | POA: Insufficient documentation

## 2023-11-03 DIAGNOSIS — R87610 Atypical squamous cells of undetermined significance on cytologic smear of cervix (ASC-US): Secondary | ICD-10-CM | POA: Insufficient documentation

## 2023-11-03 LAB — CYTOLOGY - PAP
Chlamydia: NEGATIVE
Comment: NEGATIVE
Comment: NEGATIVE
Comment: NORMAL
Diagnosis: UNDETERMINED — AB
High risk HPV: NEGATIVE
Neisseria Gonorrhea: NEGATIVE

## 2023-11-10 ENCOUNTER — Ambulatory Visit (INDEPENDENT_AMBULATORY_CARE_PROVIDER_SITE_OTHER): Payer: BC Managed Care – PPO

## 2023-11-10 DIAGNOSIS — R102 Pelvic and perineal pain: Secondary | ICD-10-CM | POA: Diagnosis not present

## 2023-11-10 DIAGNOSIS — Z8742 Personal history of other diseases of the female genital tract: Secondary | ICD-10-CM

## 2023-11-10 NOTE — Progress Notes (Signed)
 PELVIC US TA/TV: homogeneous anteverted uterus,WNL,EEC 3.8 mm,normal ovaries,ovaries appear mobile,no free fluid   Chaperone Peggy

## 2023-11-11 ENCOUNTER — Telehealth: Payer: Self-pay | Admitting: Adult Health

## 2023-11-11 NOTE — Telephone Encounter (Signed)
 Pt aware that US showed normal uterus and ovaries, no cysts and EEC 3.8 mm

## 2023-11-19 DIAGNOSIS — M25562 Pain in left knee: Secondary | ICD-10-CM | POA: Diagnosis not present

## 2023-11-25 DIAGNOSIS — R8289 Other abnormal findings on cytological and histological examination of urine: Secondary | ICD-10-CM | POA: Diagnosis not present

## 2023-11-25 DIAGNOSIS — Z131 Encounter for screening for diabetes mellitus: Secondary | ICD-10-CM | POA: Diagnosis not present

## 2023-11-25 DIAGNOSIS — Z130 Encounter for screening for diseases of the blood and blood-forming organs and certain disorders involving the immune mechanism: Secondary | ICD-10-CM | POA: Diagnosis not present

## 2023-12-04 ENCOUNTER — Ambulatory Visit
Admission: EM | Admit: 2023-12-04 | Discharge: 2023-12-04 | Disposition: A | Discharge status: Home / Self Care | Attending: Family Medicine | Admitting: Family Medicine

## 2023-12-04 DIAGNOSIS — J0100 Acute maxillary sinusitis, unspecified: Secondary | ICD-10-CM | POA: Diagnosis not present

## 2023-12-04 DIAGNOSIS — J00 Acute nasopharyngitis [common cold]: Secondary | ICD-10-CM

## 2023-12-04 LAB — POC COVID19/FLU A&B COMBO
Covid Antigen, POC: NEGATIVE
Influenza A Antigen, POC: NEGATIVE
Influenza B Antigen, POC: NEGATIVE

## 2023-12-04 MED ORDER — AZITHROMYCIN 250 MG PO TABS: 250.0000 mg | ORAL_TABLET | Freq: Every day | ORAL | 0 refills | Status: DC

## 2023-12-04 NOTE — ED Provider Notes (Signed)
 [facility]   [id number] 12/04/23 Arrival Time: 4098  ASSESSMENT & PLAN:  1. Common cold   2. Acute non-recurrent maxillary sinusitis    OTC symptom care as needed. Meds ordered this encounter  Medications   azithromycin (ZITHROMAX) 250 MG tablet    Sig: Take 1 tablet (250 mg total) by mouth daily. Take first 2 tablets together, then 1 every day until finished.    Dispense:  6 tablet    Refill:  0    Results for orders placed or performed during the hospital encounter of 12/04/23  POC Covid19/Flu A&B Antigen   Collection Time: 12/04/23  8:42 AM  Result Value Ref Range   Influenza A Antigen, POC Negative Negative   Influenza B Antigen, POC Negative Negative   Covid Antigen, POC Negative Negative    Follow-up Information     [facility].   Specialty: Urgent Care Why: As needed. Contact information: [address], [address] [place] [address] [phone]                Reviewed expectations re: course of current medical issues. Questions answered. Outlined signs and symptoms indicating need for more acute intervention. Understanding verbalized. After Visit Summary given.   SUBJECTIVE: History from: Patient. Victoria Key is a 23 y.o. female. Pt reports she has some head pressure, body aches , runny nose, cough, and nausea x past week. Now with worsening facial pain/pressure bilaterally. Denies: fever. Normal PO intake without n/v/d.  OBJECTIVE:  Vitals:   12/04/23 0840  BP: 114/71  Pulse: 96  Resp: 18  Temp: 98.2 F (36.8 C)  TempSrc: Oral  SpO2: 98%    General appearance: alert; no distress Eyes: PERRLA; EOMI; conjunctiva normal HENT: [place]; AT; with nasal congestion; TTP over maxillary sinuses Neck: supple  Lungs: speaks full sentences without difficulty; unlabored Extremities: no edema Skin: warm and dry Neurologic: normal gait Psychological: alert and cooperative; normal mood and  affect  Labs: Results for orders placed or performed during the hospital encounter of 12/04/23  POC Covid19/Flu A&B Antigen   Collection Time: 12/04/23  8:42 AM  Result Value Ref Range   Influenza A Antigen, POC Negative Negative   Influenza B Antigen, POC Negative Negative   Covid Antigen, POC Negative Negative   Labs Reviewed  POC COVID19/FLU A&B COMBO    Imaging: No results found.  Allergies  Allergen Reactions   Latex Rash and Itching    Band aids Band aids   Penicillins Anaphylaxis and Other (See Comments)    Possible serum like sickness vs angioedema  Has patient had a PCN reaction causing immediate rash, facial/tongue/throat swelling, SOB or lightheadedness with hypotension: Yes Has patient had a PCN reaction causing severe rash involving mucus membranes or skin necrosis: No Has patient had a PCN reaction that required hospitalization: Yes Has patient had a PCN reaction occurring within the last 10 years: Yes If all of the above answers are "NO", then may proceed with Cephalosporin use.   Amoxicillin-Pot Clavulanate Other (See Comments)    Possible serum like sickness vs angioedema  Has patient had a PCN reaction causing immediate rash, facial/tongue/throat swelling, SOB or lightheadedness with hypotension: Yes Has patient had a PCN reaction causing severe rash involving mucus membranes or skin necrosis: No Has patient had a PCN reaction that required hospitalization: Yes Has patient had a PCN reaction occurring within the last 10 years: Yes If all of the above answers are "NO", then  may proceed with Cephalosporin use.  Stroke like reaction   Adhesive [Tape] Rash    Past Medical History:  Diagnosis Date   Abdominal pain, recurrent    Asthma    since a baby,ususally with anxiety    Bilateral pes planus    Depression    Diagnosed since 9th grade but feels like she has had it since 6th grade   Diarrhea    Headache(784.0)    Hypermobility syndrome    Diagnosed  by [facility]    Migraine    Patellofemoral arthralgia of left knee    Scoliosis    Urinary tract infection    Social History   Socioeconomic History   Marital status: Significant Other    Spouse name: Not on file   Number of children: Not on file   Years of education: Not on file   Highest education level: Not on file  Occupational History   Not on file  Tobacco Use   Smoking status: Former    Current packs/day: 0.00    Types: Cigarettes, E-cigarettes    Quit date: 07/17/2019    Years since quitting: 4.3    Passive exposure: Never   Smokeless tobacco: Never   Tobacco comments:    mother vapes  Vaping Use   Vaping status: Former   Start date: 09/15/2017   Substances: Nicotine, THC, Flavoring  Substance and Sexual Activity   Alcohol use: Yes    Comment: once a month   Drug use: Yes    Types: Marijuana   Sexual activity: Yes    Birth control/protection: None  Other Topics Concern   Not on file  Social History Narrative   Lives with mother .[employer] 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         Social Drivers of Corporate investment banker Strain: Not on file  Food Insecurity: Not on file  Transportation Needs: Not on file  Physical Activity: Not on file  Stress: Not on file  Social Connections: Not on file  Intimate Partner Violence: Not on file   Family History  Problem Relation Age of Onset   Irritable bowel syndrome Brother    Asthma Brother    Ulcers Maternal Grandmother    Depression Maternal Grandmother    Cancer Maternal Grandmother    Hyperlipidemia Maternal Grandmother    Fibromyalgia Maternal Grandmother    Ulcers Paternal Grandfather    Hypertension Paternal Grandfather    Migraines Mother    Bipolar disorder Mother    Depression Mother    Anxiety disorder Mother    Fibromyalgia Mother    Alcohol abuse Maternal Grandfather    Depression Other    Depression Maternal Aunt    Past Surgical History:  Procedure  Laterality Date   KNEE ARTHROSCOPY WITH MEDIAL PATELLAR FEMORAL LIGAMENT RECONSTRUCTION Left 07/26/2018   Procedure: LEFT KNEE ARTHROSCOPY WITH MEDIAL PATELLAR FEMORAL LIGAMENT RECONSTRUCTION;  Surgeon: Bjorn Pippin, MD;  Location: [facility];  Service: Orthopedics;  Laterality: Left;   LABIOPLASTY Left 04/20/2018   Procedure: LEFT LABIAL REDUCTION;  Surgeon: [name], MD;  Location: [facility];  Service: Gynecology;  Laterality: Left;   URETERAL REIMPLANTION  2007   left-sided     Mardella Layman, MD 12/04/23 1148

## 2023-12-04 NOTE — ED Triage Notes (Signed)
 Pt reports she has some head pressure, body aches , runny nose, cough, and nausea x 4 days

## 2023-12-07 ENCOUNTER — Ambulatory Visit: Payer: BC Managed Care – PPO | Admitting: Adult Health

## 2024-03-02 ENCOUNTER — Ambulatory Visit
Admission: EM | Admit: 2024-03-02 | Discharge: 2024-03-02 | Disposition: A | Discharge status: Home / Self Care | Attending: Family Medicine | Admitting: Family Medicine

## 2024-03-02 DIAGNOSIS — R197 Diarrhea, unspecified: Secondary | ICD-10-CM | POA: Diagnosis not present

## 2024-03-02 DIAGNOSIS — R8290 Unspecified abnormal findings in urine: Secondary | ICD-10-CM | POA: Diagnosis not present

## 2024-03-02 DIAGNOSIS — R1010 Upper abdominal pain, unspecified: Secondary | ICD-10-CM | POA: Insufficient documentation

## 2024-03-02 LAB — POCT URINALYSIS DIP (MANUAL ENTRY)
Blood, UA: NEGATIVE
Glucose, UA: NEGATIVE mg/dL
Nitrite, UA: NEGATIVE
Protein Ur, POC: 30 mg/dL — AB
Spec Grav, UA: >=1.03 — AB (ref 1.010–1.025)
Urobilinogen, UA: 0.2 U/dL
pH, UA: 5.5 (ref 5.0–8.0)

## 2024-03-02 LAB — POCT URINE PREGNANCY: Preg Test, Ur: NEGATIVE

## 2024-03-02 MED ORDER — SUCRALFATE 1 G PO TABS: 1.0000 g | ORAL_TABLET | Freq: Three times a day (TID) | ORAL | 0 refills | Status: DC | PRN

## 2024-03-02 MED ORDER — PANTOPRAZOLE SODIUM 40 MG PO TBEC: 40.0000 mg | DELAYED_RELEASE_TABLET | Freq: Every day | ORAL | 0 refills | Status: DC

## 2024-03-02 NOTE — ED Triage Notes (Signed)
 Pt presents to UC for c/o upper abd pain x3 days. Pt reports diarrhea, body aches, vomiting and fever. Is able to keep some fluids down. Reports back pain. Took ibuprofen  and tylenol  this morning.

## 2024-03-02 NOTE — Discharge Instructions (Addendum)
 In addition to the prescribed medications, eat bland foods, drink plenty of fluids including electrolyte solution and follow-up for significantly worsening symptoms.  I have sent out a urine culture and we will let you know if you have a UTI.

## 2024-03-02 NOTE — ED Provider Notes (Signed)
 [facility]    CSN: [id number] Arrival date & time: 03/02/24  0807      History   Chief Complaint No chief complaint on file.   HPI Victoria Key is a 23 y.o. female.   Pt presents to UC for c/o upper abd pain x3 days. Pt reports diarrhea, body aches, vomiting and fever. Is able to keep some fluids down. Reports back pain. Took ibuprofen  and tylenol  this morning.       Past Medical History:  Diagnosis Date   Abdominal pain, recurrent    Asthma    since a baby,ususally with anxiety    Bilateral pes planus    Depression    Diagnosed since 9th grade but feels like she has had it since 6th grade   Diarrhea    Headache(784.0)    Hypermobility syndrome    Diagnosed by [facility]    Migraine    Patellofemoral arthralgia of left knee    Scoliosis    Urinary tract infection     Patient Active Problem List   Diagnosis Date Noted   ASCUS of cervix with negative high risk HPV 11/03/2023   History of ovarian cyst 10/28/2023   Pelvic pain 10/28/2023   Routine Papanicolaou smear 10/28/2023   Encounter for surveillance of contraceptive pills 09/03/2020   Abdominal cramping 09/03/2020   RLQ abdominal pain 05/18/2020   Encounter for initial prescription of contraceptive pills 05/18/2020   Pregnancy examination or test, negative result 05/18/2020   Screening examination for STD (sexually transmitted disease) 10/10/2019   History of trichomoniasis 10/10/2019   Trichomonas infection 08/30/2019   Body mass index (BMI) of 30.0 to 30.9 in adult 06/29/2019   Slow transit constipation 10/18/2018   Menstrual migraine without status migrainosus, not intractable 03/30/2018   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   Car sickness 06/11/2017   Chronic generalized pain 06/11/2017   GERD (gastroesophageal reflux disease) 11/07/2016   Adolescent depression 11/07/2016   Scoliosis deformity of spine 11/07/2016   Acne vulgaris 10/27/2016   Premenstrual  dysphoric syndrome 10/27/2016   Dysmenorrhea in adolescent 10/27/2016   Anxiety state 12/16/2013   Migraine without aura and without status migrainosus, not intractable 10/17/2013   Tension headache 10/17/2013    Past Surgical History:  Procedure Laterality Date   KNEE ARTHROSCOPY WITH MEDIAL PATELLAR FEMORAL LIGAMENT RECONSTRUCTION Left 07/26/2018   Procedure: LEFT KNEE ARTHROSCOPY WITH MEDIAL PATELLAR FEMORAL LIGAMENT RECONSTRUCTION;  Surgeon: Micheline Ahr, MD;  Location: [facility];  Service: Orthopedics;  Laterality: Left;   LABIOPLASTY Left 04/20/2018   Procedure: LEFT LABIAL REDUCTION;  Surgeon: Albino Hum, MD;  Location: [facility];  Service: Gynecology;  Laterality: Left;   URETERAL REIMPLANTION  2007   left-sided    OB History     Gravida  0   Para  0   Term  0   Preterm  0   AB  0   Living  0      SAB  0   IAB  0   Ectopic  0   Multiple  0   Live Births  0            Home Medications    Prior to Admission medications   Medication Sig Start Date End Date Taking? Authorizing Provider  pantoprazole (PROTONIX) 40 MG tablet Take 1 tablet (40 mg total) by mouth daily. 03/02/24  Yes Corbin Dess, PA-C  sucralfate (CARAFATE) 1 g tablet Take 1 tablet (  1 g total) by mouth 3 (three) times daily as needed. Dissolve 1 tablet into a glass of water and drink up to 3 times daily as needed 03/02/24  Yes Corbin Dess, PA-C  Acetaminophen  (TYLENOL  PO) Take by mouth.    [provider]  albuterol  (VENTOLIN  HFA) 108 (90 Base) MCG/ACT inhaler Inhale 1-2 puffs into the lungs every 6 (six) hours as needed for wheezing or shortness of breath. 11/27/22   Wilhemena Harbour, NP  azithromycin  (ZITHROMAX ) 250 MG tablet Take 1 tablet (250 mg total) by mouth daily. Take first 2 tablets together, then 1 every day until finished. 12/04/23   Afton Albright, MD  fluticasone  (FLONASE ) 50 MCG/ACT nasal spray Place 2 sprays into both nostrils  daily. 07/11/22   Leath-Warren, Belen Bowers, NP  Ibuprofen  (ADVIL  PO) Take by mouth.    [provider]  ibuprofen  (ADVIL ) 800 MG tablet Take 1 tablet (800 mg total) by mouth 3 (three) times daily. Take with food 04/14/22   Triplett, Tammy, PA-C  melatonin 5 MG TABS Take 5 mg by mouth at bedtime.    [provider]  polyethylene glycol powder (GLYCOLAX /MIRALAX ) powder Take 17 g by mouth daily. 08/06/16   McDonell, Margene Sheen, MD    Family History Family History  Problem Relation Age of Onset   Irritable bowel syndrome Brother    Asthma Brother    Ulcers Maternal Grandmother    Depression Maternal Grandmother    Cancer Maternal Grandmother    Hyperlipidemia Maternal Grandmother    Fibromyalgia Maternal Grandmother    Ulcers Paternal Grandfather    Hypertension Paternal Grandfather    Migraines Mother    Bipolar disorder Mother    Depression Mother    Anxiety disorder Mother    Fibromyalgia Mother    Alcohol abuse Maternal Grandfather    Depression Other    Depression Maternal Aunt     Social History Social History   Tobacco Use   Smoking status: Former    Current packs/day: 0.00    Types: Cigarettes, E-cigarettes    Quit date: 07/17/2019    Years since quitting: 4.6    Passive exposure: Never   Smokeless tobacco: Never   Tobacco comments:    mother vapes  Vaping Use   Vaping status: Every Day   Start date: 09/15/2017   Substances: Nicotine, THC, Flavoring  Substance Use Topics   Alcohol use: Yes    Comment: once a month   Drug use: Yes    Types: Marijuana     Allergies   Latex, Penicillins, Amoxicillin -pot clavulanate, and Adhesive [tape]   Review of Systems Review of Systems PER HPI  Physical Exam Triage Vital Signs ED Triage Vitals  Encounter Vitals Group     BP 03/02/24 0824 115/76     Girls Systolic BP Percentile --      Girls Diastolic BP Percentile --      Boys Systolic BP Percentile --      Boys Diastolic BP Percentile --       Pulse Rate 03/02/24 0824 83     Resp 03/02/24 0824 18     Temp 03/02/24 0824 98.4 F (36.9 C)     Temp Source 03/02/24 0824 Oral     SpO2 03/02/24 0824 97 %     Weight --      Height --      Head Circumference --      Peak Flow --      Pain Score  03/02/24 0822 8     Pain Loc --      Pain Education --      Exclude from Growth Chart --    No data found.  Updated Vital Signs BP 115/76 (BP Location: Right Arm)   Pulse 83   Temp 98.4 F (36.9 C) (Oral)   Resp 18   LMP 02/05/2024 (Exact Date)   SpO2 97%   Visual Acuity Right Eye Distance:   Left Eye Distance:   Bilateral Distance:    Right Eye Near:   Left Eye Near:    Bilateral Near:     Physical Exam Vitals and nursing note reviewed.  Constitutional:      Appearance: Normal appearance. She is not ill-appearing.  HENT:     Head: Atraumatic.     Mouth/Throat:     Mouth: Mucous membranes are moist.   Eyes:     Extraocular Movements: Extraocular movements intact.     Conjunctiva/sclera: Conjunctivae normal.    Cardiovascular:     Rate and Rhythm: Normal rate and regular rhythm.     Heart sounds: Normal heart sounds.  Pulmonary:     Effort: Pulmonary effort is normal.     Breath sounds: Normal breath sounds.  Abdominal:     General: Abdomen is flat. There is no distension.     Palpations: Abdomen is soft.     Tenderness: There is abdominal tenderness. There is no right CVA tenderness, left CVA tenderness or guarding.     Comments: Mild upper abdominal ttp without distention or guarding   Musculoskeletal:        General: Normal range of motion.     Cervical back: Normal range of motion and neck supple.   Skin:    General: Skin is warm and dry.   Neurological:     Mental Status: She is alert and oriented to person, place, and time.   Psychiatric:        Mood and Affect: Mood normal.        Thought Content: Thought content normal.        Judgment: Judgment normal.      UC Treatments / Results   Labs (all labs ordered are listed, but only abnormal results are displayed) Labs Reviewed  POCT URINALYSIS DIP (MANUAL ENTRY) - Abnormal; Notable for the following components:      Result Value   Clarity, UA cloudy (*)    Bilirubin, UA small (*)    Ketones, POC UA large (80) (*)    Spec Grav, UA >=1.030 (*)    Protein Ur, POC =30 (*)    Leukocytes, UA Small (1+) (*)    All other components within normal limits  URINE CULTURE  POCT URINE PREGNANCY    EKG   Radiology No results found.  Procedures Procedures (including critical care time)  Medications Ordered in UC Medications - No data to display  Initial Impression / Assessment and Plan / UC Course  I have reviewed the triage vital signs and the nursing notes.  Pertinent labs & imaging results that were available during my care of the patient were reviewed by me and considered in my medical decision making (see chart for details).     Vitals and exam reassuring today with no red flag findings.  Suspect viral GI illness versus gastritis.  Urine pregnancy negative, urinalysis showing some mild dehydration and small leuks so urine culture pending for further rule out.  Treat with Carafate, Protonix, supportive over-the-counter medications  and home care.  ED for significantly worsening symptoms.  Final Clinical Impressions(s) / UC Diagnoses   Final diagnoses:  Diarrhea, unspecified type  Abnormal urinalysis  Upper abdominal pain     Discharge Instructions      In addition to the prescribed medications, eat bland foods, drink plenty of fluids including electrolyte solution and follow-up for significantly worsening symptoms.  I have sent out a urine culture and we will let you know if you have a UTI.    ED Prescriptions     Medication Sig Dispense Auth. Provider   sucralfate (CARAFATE) 1 g tablet Take 1 tablet (1 g total) by mouth 3 (three) times daily as needed. Dissolve 1 tablet into a glass of water and drink up  to 3 times daily as needed 30 tablet Corbin Dess, PA-C   pantoprazole (PROTONIX) 40 MG tablet Take 1 tablet (40 mg total) by mouth daily. 14 tablet Corbin Dess, [place]      PDMP not reviewed this encounter.   Tacy Expose [place], [place] 03/02/24 [phone]

## 2024-03-04 ENCOUNTER — Ambulatory Visit (HOSPITAL_COMMUNITY): Payer: Self-pay

## 2024-03-04 LAB — URINE CULTURE: Culture: 10000 — AB

## 2024-03-04 MED ORDER — SULFAMETHOXAZOLE-TRIMETHOPRIM 800-160 MG PO TABS: 1.0000 | ORAL_TABLET | Freq: Two times a day (BID) | ORAL | 0 refills | Status: AC

## 2024-03-25 ENCOUNTER — Encounter (HOSPITAL_COMMUNITY): Payer: Self-pay

## 2024-03-25 ENCOUNTER — Telehealth: Payer: Self-pay | Admitting: *Deleted

## 2024-03-25 ENCOUNTER — Other Ambulatory Visit: Payer: Self-pay

## 2024-03-25 ENCOUNTER — Emergency Department (HOSPITAL_COMMUNITY)
Admission: EM | Admit: 2024-03-25 | Discharge: 2024-03-25 | Discharge status: Against Medical Advice | Source: Ambulatory Visit | Attending: Emergency Medicine | Admitting: Emergency Medicine

## 2024-03-25 DIAGNOSIS — Z5321 Procedure and treatment not carried out due to patient leaving prior to being seen by health care provider: Secondary | ICD-10-CM | POA: Insufficient documentation

## 2024-03-25 DIAGNOSIS — R39198 Other difficulties with micturition: Secondary | ICD-10-CM | POA: Diagnosis not present

## 2024-03-25 DIAGNOSIS — R102 Pelvic and perineal pain: Secondary | ICD-10-CM | POA: Diagnosis not present

## 2024-03-25 LAB — CBC WITH DIFFERENTIAL/PLATELET
Abs Immature Granulocytes: 0.02 K/uL (ref 0.00–0.07)
Basophils Absolute: 0 K/uL (ref 0.0–0.1)
Basophils Relative: 0 %
Eosinophils Absolute: 0 K/uL (ref 0.0–0.5)
Eosinophils Relative: 0 %
HCT: 43 % (ref 36.0–46.0)
Hemoglobin: 14.1 g/dL (ref 12.0–15.0)
Immature Granulocytes: 0 %
Lymphocytes Relative: 24 %
Lymphs Abs: 2.3 K/uL (ref 0.7–4.0)
MCH: 28.6 pg (ref 26.0–34.0)
MCHC: 32.8 g/dL (ref 30.0–36.0)
MCV: 87.2 fL (ref 80.0–100.0)
Monocytes Absolute: 1 K/uL (ref 0.1–1.0)
Monocytes Relative: 10 %
Neutro Abs: 6.1 K/uL (ref 1.7–7.7)
Neutrophils Relative %: 66 %
Platelets: 287 K/uL (ref 150–400)
RBC: 4.93 MIL/uL (ref 3.87–5.11)
RDW: 12.3 % (ref 11.5–15.5)
WBC: 9.5 K/uL (ref 4.0–10.5)
nRBC: 0 % (ref 0.0–0.2)

## 2024-03-25 LAB — URINALYSIS, ROUTINE W REFLEX MICROSCOPIC
Bilirubin Urine: NEGATIVE
Glucose, UA: NEGATIVE mg/dL
Hgb urine dipstick: NEGATIVE
Ketones, ur: 5 mg/dL — AB
Nitrite: NEGATIVE
Protein, ur: 30 mg/dL — AB
Specific Gravity, Urine: 1.029 (ref 1.005–1.030)
pH: 5 (ref 5.0–8.0)

## 2024-03-25 LAB — BASIC METABOLIC PANEL WITH GFR
Anion gap: 13 (ref 5–15)
BUN: 8 mg/dL (ref 6–20)
CO2: 21 mmol/L — ABNORMAL LOW (ref 22–32)
Calcium: 9.6 mg/dL (ref 8.9–10.3)
Chloride: 108 mmol/L (ref 98–111)
Creatinine, Ser: 0.77 mg/dL (ref 0.44–1.00)
GFR, Estimated: >60 mL/min (ref >60)
Glucose, Bld: 99 mg/dL (ref 70–99)
Potassium: 3.6 mmol/L (ref 3.5–5.1)
Sodium: 142 mmol/L (ref 135–145)

## 2024-03-25 LAB — PREGNANCY, URINE: Preg Test, Ur: NEGATIVE

## 2024-03-25 NOTE — ED Triage Notes (Signed)
 Reports thinks she had a ovarian cyst rupture around midnight.  Reports she has had this before.  But also complains of difficulty urinating and having a BM.  Complains of white sticky discharge.  LMP last Wednesday.

## 2024-03-25 NOTE — Telephone Encounter (Signed)
 Patient called with c/o left sided lower abdominal pain that radiates to her back and rib cage. States she has been taking Ibuprofen  and Tylenol  but neither are helping. She also felt feverish last night and is certain she has had a cyst rupture as she has had this in the past and felt this way.  Discuss with Victoria Lewis, NP and patient advised to go to ER for evaluation. Verbalized understanding and agreeable to go.

## 2024-03-26 ENCOUNTER — Encounter (HOSPITAL_COMMUNITY): Payer: Self-pay

## 2024-03-26 ENCOUNTER — Other Ambulatory Visit: Payer: Self-pay

## 2024-03-26 ENCOUNTER — Emergency Department (HOSPITAL_COMMUNITY)
Admission: EM | Admit: 2024-03-26 | Discharge: 2024-03-26 | Disposition: A | Discharge status: Home / Self Care | Attending: Emergency Medicine | Admitting: Emergency Medicine

## 2024-03-26 DIAGNOSIS — N760 Acute vaginitis: Secondary | ICD-10-CM | POA: Diagnosis not present

## 2024-03-26 DIAGNOSIS — Z7951 Long term (current) use of inhaled steroids: Secondary | ICD-10-CM | POA: Diagnosis not present

## 2024-03-26 DIAGNOSIS — R102 Pelvic and perineal pain: Secondary | ICD-10-CM | POA: Insufficient documentation

## 2024-03-26 DIAGNOSIS — Z91040 Latex allergy status: Secondary | ICD-10-CM | POA: Diagnosis not present

## 2024-03-26 DIAGNOSIS — B9689 Other specified bacterial agents as the cause of diseases classified elsewhere: Secondary | ICD-10-CM | POA: Diagnosis not present

## 2024-03-26 DIAGNOSIS — J45909 Unspecified asthma, uncomplicated: Secondary | ICD-10-CM | POA: Insufficient documentation

## 2024-03-26 DIAGNOSIS — R1032 Left lower quadrant pain: Secondary | ICD-10-CM | POA: Diagnosis not present

## 2024-03-26 LAB — URINALYSIS, ROUTINE W REFLEX MICROSCOPIC
Bacteria, UA: NONE SEEN
Bilirubin Urine: NEGATIVE
Glucose, UA: NEGATIVE mg/dL
Hgb urine dipstick: NEGATIVE
Ketones, ur: 20 mg/dL — AB
Nitrite: NEGATIVE
Protein, ur: 30 mg/dL — AB
Specific Gravity, Urine: 1.031 — ABNORMAL HIGH (ref 1.005–1.030)
pH: 5 (ref 5.0–8.0)

## 2024-03-26 LAB — COMPREHENSIVE METABOLIC PANEL WITH GFR
ALT: 17 U/L (ref 0–44)
AST: 16 U/L (ref 15–41)
Albumin: 4.2 g/dL (ref 3.5–5.0)
Alkaline Phosphatase: 56 U/L (ref 38–126)
Anion gap: 11 (ref 5–15)
BUN: 11 mg/dL (ref 6–20)
CO2: 20 mmol/L — ABNORMAL LOW (ref 22–32)
Calcium: 9.2 mg/dL (ref 8.9–10.3)
Chloride: 108 mmol/L (ref 98–111)
Creatinine, Ser: 0.76 mg/dL (ref 0.44–1.00)
GFR, Estimated: >60 mL/min (ref >60)
Glucose, Bld: 95 mg/dL (ref 70–99)
Potassium: 3.9 mmol/L (ref 3.5–5.1)
Sodium: 139 mmol/L (ref 135–145)
Total Bilirubin: 0.6 mg/dL (ref 0.0–1.2)
Total Protein: 7.2 g/dL (ref 6.5–8.1)

## 2024-03-26 LAB — CBC WITH DIFFERENTIAL/PLATELET
Abs Immature Granulocytes: 0.02 K/uL (ref 0.00–0.07)
Basophils Absolute: 0 K/uL (ref 0.0–0.1)
Basophils Relative: 1 %
Eosinophils Absolute: 0 K/uL (ref 0.0–0.5)
Eosinophils Relative: 1 %
HCT: 42.3 % (ref 36.0–46.0)
Hemoglobin: 13.8 g/dL (ref 12.0–15.0)
Immature Granulocytes: 0 %
Lymphocytes Relative: 23 %
Lymphs Abs: 1.4 K/uL (ref 0.7–4.0)
MCH: 28.2 pg (ref 26.0–34.0)
MCHC: 32.6 g/dL (ref 30.0–36.0)
MCV: 86.5 fL (ref 80.0–100.0)
Monocytes Absolute: 0.6 K/uL (ref 0.1–1.0)
Monocytes Relative: 10 %
Neutro Abs: 4 K/uL (ref 1.7–7.7)
Neutrophils Relative %: 65 %
Platelets: 248 K/uL (ref 150–400)
RBC: 4.89 MIL/uL (ref 3.87–5.11)
RDW: 12.2 % (ref 11.5–15.5)
WBC: 6.1 K/uL (ref 4.0–10.5)
nRBC: 0 % (ref 0.0–0.2)

## 2024-03-26 LAB — PREGNANCY, URINE: Preg Test, Ur: NEGATIVE

## 2024-03-26 LAB — WET PREP, GENITAL
Sperm: NONE SEEN
Trich, Wet Prep: NONE SEEN
WBC, Wet Prep HPF POC: >=10 — AB (ref <10)
Yeast Wet Prep HPF POC: NONE SEEN

## 2024-03-26 MED ORDER — DOXYCYCLINE HYCLATE 100 MG PO TABS
100.0000 mg | ORAL_TABLET | Freq: Once | ORAL | Status: AC
Start: 2024-03-26 — End: 2024-03-26
  Administered 2024-03-26: 100 mg via ORAL
  Filled 2024-03-26: qty 1

## 2024-03-26 MED ORDER — KETOROLAC TROMETHAMINE 15 MG/ML IJ SOLN
15.0000 mg | Freq: Once | INTRAMUSCULAR | Status: DC
Start: 2024-03-26 — End: 2024-03-26

## 2024-03-26 MED ORDER — METRONIDAZOLE 500 MG/100ML IV SOLN: 500.0000 mg | Freq: Once | INTRAVENOUS | Status: DC

## 2024-03-26 MED ORDER — METRONIDAZOLE 500 MG PO TABS: 500.0000 mg | ORAL_TABLET | Freq: Two times a day (BID) | ORAL | 0 refills | Status: DC

## 2024-03-26 MED ORDER — KETOROLAC TROMETHAMINE 15 MG/ML IJ SOLN
15.0000 mg | Freq: Once | INTRAMUSCULAR | Status: AC
Start: 2024-03-26 — End: 2024-03-26
  Administered 2024-03-26: 15 mg via INTRAVENOUS
  Filled 2024-03-26: qty 1

## 2024-03-26 MED ORDER — METRONIDAZOLE 500 MG PO TABS
500.0000 mg | ORAL_TABLET | Freq: Once | ORAL | Status: AC
  Administered 2024-03-26: 500 mg via ORAL
  Filled 2024-03-26: qty 1

## 2024-03-26 MED ORDER — ONDANSETRON HCL 4 MG/2ML IJ SOLN: 4.0000 mg | Freq: Once | INTRAMUSCULAR | Status: DC

## 2024-03-26 MED ORDER — NAPROXEN 500 MG PO TABS: 500.0000 mg | ORAL_TABLET | Freq: Two times a day (BID) | ORAL | 0 refills | Status: DC

## 2024-03-26 MED ORDER — ONDANSETRON 4 MG PO TBDP
4.0000 mg | ORAL_TABLET | Freq: Once | ORAL | Status: AC
  Administered 2024-03-26: 4 mg via ORAL
  Filled 2024-03-26: qty 1

## 2024-03-26 MED ORDER — DOXYCYCLINE HYCLATE 100 MG PO CAPS: 100.0000 mg | ORAL_CAPSULE | Freq: Two times a day (BID) | ORAL | 0 refills | Status: AC

## 2024-03-26 MED ORDER — CEFTRIAXONE SODIUM 500 MG IJ SOLR
500.0000 mg | Freq: Once | INTRAMUSCULAR | Status: AC
Start: 2024-03-26 — End: 2024-03-26
  Administered 2024-03-26: 500 mg via INTRAMUSCULAR
  Filled 2024-03-26: qty 500

## 2024-03-26 NOTE — ED Triage Notes (Signed)
 Patient was here yesterday but left due to wait times.  Patient complains of left lower quad pain possible ruputured ovarian cyst.  Complains pain is worse and goes into her hip.

## 2024-03-26 NOTE — ED Provider Notes (Signed)
 [place] EMERGENCY DEPARTMENT AT [facility] Provider Note   CSN: [id number] Arrival date & time: 03/26/24  1018     Patient presents with: Abdominal Pain   Victoria Key is a 23 y.o. female.  She has a history of anxiety, GERD, asthma.  Presents the ER for left lower quadrant abdominal pain, has been ongoing for 3 days, she was in the ER last night but left due to extended wait time, pain goes into the left hip.  She denies fever or chills.  No nausea or vomiting.  She states she had some vaginal discharge before last.  But it was normal.  Pain is worse with movement.  Denies new sexual partners, denies vaginal itching.  She states she thinks she may have had an ovarian cyst rupture.    Abdominal Pain      Prior to Admission medications   Medication Sig Start Date End Date Taking? Authorizing Provider  doxycycline  (VIBRAMYCIN ) 100 MG capsule Take 1 capsule (100 mg total) by mouth 2 (two) times daily for 7 days. 03/26/24 04/02/24 Yes Marshaun Lortie A, PA-C  metroNIDAZOLE  (FLAGYL ) 500 MG tablet Take 1 tablet (500 mg total) by mouth 2 (two) times daily. 03/26/24  Yes Cortlyn Cannell A, PA-C  naproxen  (NAPROSYN ) 500 MG tablet Take 1 tablet (500 mg total) by mouth 2 (two) times daily. 03/26/24  Yes Kiwanna Spraker A, PA-C  Acetaminophen  (TYLENOL  PO) Take by mouth.    [provider]  albuterol  (VENTOLIN  HFA) 108 (90 Base) MCG/ACT inhaler Inhale 1-2 puffs into the lungs every 6 (six) hours as needed for wheezing or shortness of breath. 11/27/22   Chandra Harlene LABOR, NP  azithromycin  (ZITHROMAX ) 250 MG tablet Take 1 tablet (250 mg total) by mouth daily. Take first 2 tablets together, then 1 every day until finished. 12/04/23   Rolinda Rogue, MD  fluticasone  (FLONASE ) 50 MCG/ACT nasal spray Place 2 sprays into both nostrils daily. 07/11/22   Leath-Warren, Etta PARAS, NP  Ibuprofen  (ADVIL  PO) Take by mouth.    [provider]  ibuprofen  (ADVIL ) 800 MG tablet Take 1  tablet (800 mg total) by mouth 3 (three) times daily. Take with food 04/14/22   Triplett, Tammy, PA-C  melatonin 5 MG TABS Take 5 mg by mouth at bedtime.    [provider]  pantoprazole  (PROTONIX ) 40 MG tablet Take 1 tablet (40 mg total) by mouth daily. 03/02/24   Stuart Vernell Norris, PA-C  polyethylene glycol powder (GLYCOLAX /MIRALAX ) powder Take 17 g by mouth daily. 08/06/16   McDonell, Ronal Amble, MD  sucralfate  (CARAFATE ) 1 g tablet Take 1 tablet (1 g total) by mouth 3 (three) times daily as needed. Dissolve 1 tablet into a glass of water and drink up to 3 times daily as needed 03/02/24   Stuart Vernell Norris, PA-C    Allergies: Latex, Penicillins, Amoxicillin -pot clavulanate, and Adhesive [tape]    Review of Systems  Gastrointestinal:  Positive for abdominal pain.    Updated Vital Signs BP 110/70   Pulse 78   Temp 97.9 F (36.6 C) (Oral)   Resp 14   Ht 5' 1.5 (1.562 m)   Wt 61.7 kg   LMP 01/27/2024 (Exact Date)   SpO2 100%   BMI 25.28 kg/m   Physical Exam Vitals and nursing note reviewed. Exam conducted with a chaperone present.  Constitutional:      General: She is not in acute distress.    Appearance: She is well-developed.  HENT:  Head: Normocephalic and atraumatic.  Eyes:     Conjunctiva/sclera: Conjunctivae normal.  Cardiovascular:     Rate and Rhythm: Normal rate and regular rhythm.     Heart sounds: No murmur heard. Pulmonary:     Effort: Pulmonary effort is normal. No respiratory distress.     Breath sounds: Normal breath sounds.  Abdominal:     Palpations: Abdomen is soft.     Tenderness: There is no abdominal tenderness.  Genitourinary:    General: Normal vulva.     Cervix: Erythema present. No cervical motion tenderness, friability or lesion.     Uterus: Normal.      Adnexa:        Right: No mass, tenderness or fullness.         Left: Tenderness present. No mass or fullness.       Comments: Small amount of green discharge noted in  posterior fornix, mild erythema to inferior portion of cervix Musculoskeletal:        General: No swelling.     Cervical back: Neck supple.  Skin:    General: Skin is warm and dry.     Capillary Refill: Capillary refill takes less than 2 seconds.  Neurological:     General: No focal deficit present.     Mental Status: She is alert and oriented to person, place, and time.  Psychiatric:        Mood and Affect: Mood normal.     (all labs ordered are listed, but only abnormal results are displayed) Labs Reviewed  WET PREP, GENITAL - Abnormal; Notable for the following components:      Result Value   Clue Cells Wet Prep HPF POC PRESENT (*)    WBC, Wet Prep HPF POC >=10 (*)    All other components within normal limits  COMPREHENSIVE METABOLIC PANEL WITH GFR - Abnormal; Notable for the following components:   CO2 20 (*)    All other components within normal limits  URINALYSIS, ROUTINE W REFLEX MICROSCOPIC - Abnormal; Notable for the following components:   APPearance HAZY (*)    Specific Gravity, Urine 1.031 (*)    Ketones, ur 20 (*)    Protein, ur 30 (*)    Leukocytes,Ua TRACE (*)    All other components within normal limits  CBC WITH DIFFERENTIAL/PLATELET  PREGNANCY, URINE  GC/CHLAMYDIA PROBE AMP ([place]) NOT AT [facility]    EKG: None  Radiology: No results found.   Procedures   Medications Ordered in the ED  ketorolac  (TORADOL ) 15 MG/ML injection 15 mg (15 mg Intravenous Given 03/26/24 1222)  cefTRIAXone  (ROCEPHIN ) injection 500 mg (500 mg Intramuscular Given 03/26/24 1352)  doxycycline  (VIBRA -TABS) tablet 100 mg (100 mg Oral Given 03/26/24 1416)  metroNIDAZOLE  (FLAGYL ) tablet 500 mg (500 mg Oral Given 03/26/24 1354)  ondansetron  (ZOFRAN -ODT) disintegrating tablet 4 mg (4 mg Oral Given 03/26/24 1353)                                    Medical Decision Making Differential diagnosis includes but limited to diverticulitis, colitis, ovarian torsion, ovarian cyst, kidney  stone, UTI, PID, other  ED course: Patient here for left lower pelvic pain for the past couple days, did not stay for evaluation last night due to extended wait times and comes back today for evaluation.  On exam she is well-appearing, well-hydrated, abdomen is soft with minimal left lower quadrant tenderness, no left  CVA tenderness.  Her labs are overall very reassuring, she does not have a leukocytosis, no AKI or abnormal liver function tests, she not anemic, UA does have some ketones trace leukocytes, 16 white blood cells with no bacteria and it is contaminated specimen.  I do not feel she has UTI, as she is not having UTI symptoms.    I discussed with family the prospect of imaging, at the time I was evaluating the patient ultrasound had left for the day and I do not feel she has torsion as she is very comfortable and this was not abrupt onset.  I offered CT scan to rule out possible ovarian pathology or kidney stone.  We had a shared decision-making discussion with patient and her mother at bedside.  At this time they feel comfortable going home without imaging.    I offered a pelvic exam as well and patient was agreeable with this.  She denied any vaginal discharge but on exam she does have some thick greenish discharge, wet mount shows clue cells and greater than 10 white blood cells, she has some mild left adnexal tenderness.  Will treat her with Flagyl  will also treat with Rocephin  and Doxy for possible STI, discussed with patient that we will can take couple of days for her results for GC and chlamydia.  She is going to follows up on MyChart and follow-up with OB/GYN.  [facility]  She is going to follow-up with them, we discussed they could order outpatient ultrasound if she continues to have pain.  She was given strict return precautions.  Amount and/or Complexity of Data Reviewed Labs: ordered.  Risk Prescription drug management.        Final diagnoses:  Pelvic pain  in female    ED Discharge Orders          Ordered    doxycycline  (VIBRAMYCIN ) 100 MG capsule  2 times daily        03/26/24 1406    metroNIDAZOLE  (FLAGYL ) 500 MG tablet  2 times daily        03/26/24 1406    naproxen  (NAPROSYN ) 500 MG tablet  2 times daily        03/26/24 [address], [place] A, PA-C 03/26/24 1819    Elnor Jayson LABOR, DO 03/27/24 2126

## 2024-03-26 NOTE — Discharge Instructions (Addendum)
 It was a pleasure taking care of you.  You were seen in the ER for pelvic pain.  Your blood work was reassuring, your pelvic exam did reveal some discharge and bacterial vaginosis.  We are treating you with antibiotics for the vaginosis and will start antibiotics for possible pelvic infection while you are other vaginal swabs are still in process.  You can follow-up with these on MyChart but should receive a phone call if these are positive.  Please follow-up with your OB/GYN in the next 2 to 3 days for further evaluation.  If you are still having pain they may want to order an outpatient ultrasound.  Please come back to the ER you have worsening pain, fevers or other worrisome changes.  Do not drink alcohol with antibiotics.  The antibiotic doxycycline  can make you sensitive to the sun so please stay out of the sun, wear sunscreen or protective clothing if you have to be outside.  You were prescribed naproxen  for pain.  You take this as prescribed.  Do not take it with ibuprofen , Aleve  or aspirin over-the-counter as they are similar and can cause stomach ulcers or bleeding.  You can take over-the-counter Tylenol  as directed on the packaging however.

## 2024-03-27 ENCOUNTER — Emergency Department (HOSPITAL_COMMUNITY): Admission: EM | Admit: 2024-03-27 | Discharge: 2024-03-27 | Disposition: A | Discharge status: Home / Self Care

## 2024-03-27 ENCOUNTER — Encounter (HOSPITAL_COMMUNITY): Payer: Self-pay | Admitting: Emergency Medicine

## 2024-03-27 ENCOUNTER — Other Ambulatory Visit: Payer: Self-pay

## 2024-03-27 DIAGNOSIS — N946 Dysmenorrhea, unspecified: Secondary | ICD-10-CM | POA: Insufficient documentation

## 2024-03-27 DIAGNOSIS — R102 Pelvic and perineal pain: Secondary | ICD-10-CM | POA: Diagnosis not present

## 2024-03-27 DIAGNOSIS — Z91040 Latex allergy status: Secondary | ICD-10-CM | POA: Diagnosis not present

## 2024-03-27 MED ORDER — KETOROLAC TROMETHAMINE 15 MG/ML IJ SOLN
15.0000 mg | Freq: Once | INTRAMUSCULAR | Status: AC
  Administered 2024-03-27: 15 mg via INTRAMUSCULAR
  Filled 2024-03-27: qty 1

## 2024-03-27 MED ORDER — ONDANSETRON 4 MG PO TBDP
4.0000 mg | ORAL_TABLET | Freq: Once | ORAL | Status: AC
  Administered 2024-03-27: 4 mg via ORAL
  Filled 2024-03-27: qty 1

## 2024-03-27 MED ORDER — ONDANSETRON HCL 4 MG PO TABS: 4.0000 mg | ORAL_TABLET | Freq: Three times a day (TID) | ORAL | Status: DC | PRN

## 2024-03-27 MED ORDER — OXYCODONE HCL 5 MG PO TABS
5.0000 mg | ORAL_TABLET | Freq: Once | ORAL | Status: AC
  Administered 2024-03-27: 5 mg via ORAL
  Filled 2024-03-27: qty 1

## 2024-03-27 NOTE — Discharge Instructions (Signed)
 You can take Zofran  as needed for nausea and vomiting.  Take care pain meds as prescribed yesterday while you are in the ER and use Tylenol  and Motrin  as needed for pain.  Drink lots of fluids.  Continue your antibiotics as prescribed.  Call OB/GYN in the morning to make a follow-up appointment.  Return to the ER for new or worsening symptoms.

## 2024-03-27 NOTE — ED Provider Notes (Signed)
 [place] EMERGENCY DEPARTMENT AT [facility] Provider Note   CSN: [id number] Arrival date & time: 03/27/24  9181     Patient presents with: Abdominal Pain   Victoria Key is a 23 y.o. female.   23 year old female presents for evaluation of pelvic pain.  She was here yesterday and 2 days prior.  Yesterday she was diagnosed with BV started on antibiotics and given pain meds.  Has not taken the pain meds yet but today started having some bleeding and is having allover pelvic pain and cramping.  She is not passing any blood clots but states her periods have been irregular.  She declined any imaging last night.  She states she has been feeling somewhat nauseous as well.  Denies any other symptoms or concerns at this time.   Abdominal Pain Associated symptoms: nausea   Associated symptoms: no chest pain, no chills, no cough, no dysuria, no fever, no hematuria, no shortness of breath, no sore throat and no vomiting        Prior to Admission medications   Medication Sig Start Date End Date Taking? Authorizing Provider  ondansetron  (ZOFRAN ) 4 MG tablet Take 1 tablet (4 mg total) by mouth every 8 (eight) hours as needed for up to 4 days for nausea or vomiting. 03/27/24 03/31/24 Yes Giselle Brutus L, DO  Acetaminophen  (TYLENOL  PO) Take by mouth.    [provider]  albuterol  (VENTOLIN  HFA) 108 (90 Base) MCG/ACT inhaler Inhale 1-2 puffs into the lungs every 6 (six) hours as needed for wheezing or shortness of breath. 11/27/22   Chandra Harlene LABOR, NP  azithromycin  (ZITHROMAX ) 250 MG tablet Take 1 tablet (250 mg total) by mouth daily. Take first 2 tablets together, then 1 every day until finished. 12/04/23   Rolinda Rogue, MD  doxycycline  (VIBRAMYCIN ) 100 MG capsule Take 1 capsule (100 mg total) by mouth 2 (two) times daily for 7 days. 03/26/24 04/02/24  Suellen Cantor A, PA-C  fluticasone  (FLONASE ) 50 MCG/ACT nasal spray Place 2 sprays into both nostrils daily. 07/11/22    Leath-Warren, Etta PARAS, NP  Ibuprofen  (ADVIL  PO) Take by mouth.    [provider]  ibuprofen  (ADVIL ) 800 MG tablet Take 1 tablet (800 mg total) by mouth 3 (three) times daily. Take with food 04/14/22   Triplett, Tammy, PA-C  melatonin 5 MG TABS Take 5 mg by mouth at bedtime.    [provider]  metroNIDAZOLE  (FLAGYL ) 500 MG tablet Take 1 tablet (500 mg total) by mouth 2 (two) times daily. 03/26/24   Suellen Cantor A, PA-C  naproxen  (NAPROSYN ) 500 MG tablet Take 1 tablet (500 mg total) by mouth 2 (two) times daily. 03/26/24   Suellen Cantor A, PA-C  pantoprazole  (PROTONIX ) 40 MG tablet Take 1 tablet (40 mg total) by mouth daily. 03/02/24   Stuart Vernell Norris, PA-C  polyethylene glycol powder (GLYCOLAX /MIRALAX ) powder Take 17 g by mouth daily. 08/06/16   McDonell, Ronal Amble, MD  sucralfate  (CARAFATE ) 1 g tablet Take 1 tablet (1 g total) by mouth 3 (three) times daily as needed. Dissolve 1 tablet into a glass of water and drink up to 3 times daily as needed 03/02/24   Stuart Vernell Norris, PA-C    Allergies: Latex, Penicillins, Amoxicillin -pot clavulanate, and Adhesive [tape]    Review of Systems  Constitutional:  Negative for chills and fever.  HENT:  Negative for ear pain and sore throat.   Eyes:  Negative for pain and visual disturbance.  Respiratory:  Negative for cough and shortness of breath.   Cardiovascular:  Negative for chest pain and palpitations.  Gastrointestinal:  Positive for abdominal pain and nausea. Negative for vomiting.  Genitourinary:  Negative for dysuria and hematuria.  Musculoskeletal:  Negative for arthralgias and back pain.  Skin:  Negative for color change and rash.  Neurological:  Negative for seizures and syncope.  All other systems reviewed and are negative.   Updated Vital Signs BP 112/75 (BP Location: Left Arm)   Pulse 99   Temp 98.7 F (37.1 C) (Oral)   Resp 16   Ht 5' 1.5 (1.562 m)   Wt 61 kg   LMP 03/26/2024 (Exact Date)    SpO2 99%   BMI 25.00 kg/m   Physical Exam Vitals and nursing note reviewed.  Constitutional:      General: She is not in acute distress.    Appearance: She is well-developed. She is not ill-appearing.  HENT:     Head: Normocephalic and atraumatic.  Eyes:     Conjunctiva/sclera: Conjunctivae normal.  Cardiovascular:     Rate and Rhythm: Normal rate and regular rhythm.     Heart sounds: No murmur heard. Pulmonary:     Effort: Pulmonary effort is normal. No respiratory distress.     Breath sounds: Normal breath sounds.  Abdominal:     Palpations: Abdomen is soft.     Tenderness: There is generalized abdominal tenderness.  Musculoskeletal:        General: No swelling.     Cervical back: Neck supple.  Skin:    General: Skin is warm and dry.     Capillary Refill: Capillary refill takes less than 2 seconds.  Neurological:     Mental Status: She is alert.  Psychiatric:        Mood and Affect: Mood normal.     (all labs ordered are listed, but only abnormal results are displayed) Labs Reviewed - No data to display  EKG: None  Radiology: No results found.   Procedures   Medications Ordered in the ED  ketorolac  (TORADOL ) 15 MG/ML injection 15 mg (has no administration in time range)  ondansetron  (ZOFRAN -ODT) disintegrating tablet 4 mg (has no administration in time range)  oxyCODONE  (Oxy IR/ROXICODONE ) immediate release tablet 5 mg (has no administration in time range)                                    Medical Decision Making Patient here for pelvic pain after she started her cycle today.  Was diagnosed with BV yesterday and seen here with otherwise negative workup.  She appears well has some generalized abdominal tenderness.  Will give her Toradol  and oxycodone  here.  I will give her prescription for Zofran .  She could be nauseous from her pregnancy but also from the antibiotic she is taking.  Advised to increase her fluid intake continue Tylenol  Motrin  as needed and  can use her pain meds as needed at home as she has not taken them yet.  Advise close up with OB/GYN [place] and they will plan to call the office in the morning.  Given strict return precautions.  Patient and family at bedside feel comfortable being discharged home.  Problems Addressed: Dysmenorrhea: acute illness or injury Pelvic pain: acute illness or injury  Amount and/or Complexity of Data Reviewed External Data Reviewed: notes.    Details: Prior ED records reviewed and patient was seen yesterday  diagnosed with BV.  Lab workup fairly unremarkable otherwise and she declined imaging.  Was also here 2 days ago for similar symptoms but left as the wait times were increased  Risk OTC drugs. Prescription drug management.     Final diagnoses:  Dysmenorrhea  Pelvic pain    ED Discharge Orders          Ordered    ondansetron  (ZOFRAN ) 4 MG tablet  Every 8 hours PRN        03/27/24 0847               Ellerie Arenz L, DO 03/27/24 [phone]

## 2024-03-27 NOTE — ED Triage Notes (Signed)
 Pt here yesterday for pelvic pain and diagnosed with BV. States the pain worsened in lower abd, lower back and hips. Also started spotting.

## 2024-03-28 ENCOUNTER — Telehealth (HOSPITAL_COMMUNITY): Payer: Self-pay | Admitting: Emergency Medicine

## 2024-03-28 LAB — GC/CHLAMYDIA PROBE AMP (~~LOC~~) NOT AT ARMC
Chlamydia: NEGATIVE
Comment: NEGATIVE
Comment: NORMAL
Neisseria Gonorrhea: NEGATIVE

## 2024-03-28 MED ORDER — ONDANSETRON HCL 4 MG PO TABS: 4.0000 mg | ORAL_TABLET | Freq: Three times a day (TID) | ORAL | Status: AC | PRN

## 2024-03-28 NOTE — Telephone Encounter (Cosign Needed)
 Patient needs prescriptions resent to pharmacy.

## 2024-03-28 NOTE — Telephone Encounter (Cosign Needed)
 Needs prescriptions resent to pharmacy.

## 2024-03-30 ENCOUNTER — Ambulatory Visit: Admitting: Adult Health

## 2024-03-30 ENCOUNTER — Encounter: Payer: Self-pay | Admitting: Adult Health

## 2024-03-30 VITALS — BP 116/74 | HR 88 | Ht 61.5 in | Wt 134.5 lb

## 2024-03-30 DIAGNOSIS — R102 Pelvic and perineal pain: Secondary | ICD-10-CM

## 2024-03-30 DIAGNOSIS — Z30011 Encounter for initial prescription of contraceptive pills: Secondary | ICD-10-CM | POA: Diagnosis not present

## 2024-03-30 DIAGNOSIS — N730 Acute parametritis and pelvic cellulitis: Secondary | ICD-10-CM

## 2024-03-30 DIAGNOSIS — N946 Dysmenorrhea, unspecified: Secondary | ICD-10-CM | POA: Diagnosis not present

## 2024-03-30 MED ORDER — NORETHIN ACE-ETH ESTRAD-FE 1-20 MG-MCG PO TABS: 1.0000 | ORAL_TABLET | Freq: Every day | ORAL | 3 refills | Status: DC

## 2024-03-30 NOTE — Progress Notes (Signed)
  Subjective:     Patient ID: Victoria Key, female   DOB: 11/15/2000, 23 y.o.   MRN: [id number]  HPI Victoria Key is a 23 year old white female, with SO, G0P0, in for follow up for being seen in ER 03/27/24 for pelvic pain and treated for PID.  She received IM injection and is taking doxycyline and flagyl . She is still having back pain, and pelvic pain. GC/CHL was negative and UPT was negative.      Component Value Date/Time   DIAGPAP (A) 10/28/2023 1415    - Atypical squamous cells of undetermined significance (ASC-US )   HPVHIGH Negative 10/28/2023 1415   ADEQPAP  10/28/2023 1415    Satisfactory for evaluation; transformation zone component PRESENT.   PCP is Dr Shona.  Review of Systems +pelvic pain and back pain Has painful periods   Reviewed past medical,surgical, social and family history. Reviewed medications and allergies.  Objective:   Physical Exam BP 116/74 (BP Location: Left Arm, Patient Position: Sitting, Cuff Size: Normal)   Pulse 88   Ht 5' 1.5 (1.562 m)   Wt 134 lb 8 oz (61 kg)   LMP 03/26/2024 (Exact Date)   BMI 25.00 kg/m     Skin warm and dry.Pelvic: external genitalia is normal in appearance no lesions, vagina: pinkish discharge,urethra has no lesions or masses noted, cervix:smooth, no CMT today, but tender, uterus: normal size, shape and contour, mildly tender, no masses felt, adnexa: no masses, +LLQ tenderness noted. Bladder is non tender and no masses felt.  Upstream - 03/30/24 1228       Pregnancy Intention Screening   Does the patient want to become pregnant in the next year? No    Does the patient's partner want to become pregnant in the next year? No    Would the patient like to discuss contraceptive options today? Yes      Contraception Wrap Up   Current Method Withdrawal or Other Method    End Method Oral Contraceptive    Contraception Counseling Provided Yes    How was the end contraceptive method provided? Prescription          Assessment:      1. Pelvic pain +pelvic pain and back pain, treated for PID in ER  Still has pain   2. PID (acute pelvic inflammatory disease) (Primary) Treated for PID in ER 03/27/24 Finish doxycycline  and flagyl  No sex   3. Dysmenorrhea Has painful periods  4. Encounter for initial prescription of contraceptive pills Denies MI,stroke,DVT,breast cancer or migraine with aura Will rx junel 1-20 Meds ordered this encounter  Medications   norethindrone-ethinyl estradiol -FE (LOESTRIN  FE) 1-20 MG-MCG tablet    Sig: Take 1 tablet by mouth daily.    Dispense:  84 tablet    Refill:  3    Supervising Provider:   JAYNE VONN DEL [[id number]]       Plan:     Follow up in 8 days with Victoria Key for recheck and ROS

## 2024-04-07 ENCOUNTER — Encounter: Payer: Self-pay | Admitting: Advanced Practice Midwife

## 2024-04-07 ENCOUNTER — Ambulatory Visit: Admitting: Advanced Practice Midwife

## 2024-04-07 VITALS — BP 110/74 | HR 81 | Ht 61.0 in | Wt 131.0 lb

## 2024-04-07 DIAGNOSIS — N730 Acute parametritis and pelvic cellulitis: Secondary | ICD-10-CM

## 2024-04-07 DIAGNOSIS — N898 Other specified noninflammatory disorders of vagina: Secondary | ICD-10-CM

## 2024-04-07 MED ORDER — FLUCONAZOLE 150 MG PO TABS: ORAL_TABLET | ORAL | 2 refills | Status: DC

## 2024-04-07 NOTE — Progress Notes (Deleted)
 [facility] Visit  Patient name: Victoria Key MRN [id number]  Date of birth: 2001/05/26  CC & HPI:  Victoria Key is a 23 y.o. {Race/ethnicity:[id number]} female presenting today for ***  Pertinent History Reviewed:  Medical & Surgical Hx:   Past Medical History:  Diagnosis Date   Abdominal pain, recurrent    Asthma    since a baby,ususally with anxiety    Bilateral pes planus    Depression    Diagnosed since 9th grade but feels like she has had it since 6th grade   Diarrhea    Headache(784.0)    Hypermobility syndrome    Diagnosed by [facility]    Migraine    Patellofemoral arthralgia of left knee    Scoliosis    Urinary tract infection    Past Surgical History:  Procedure Laterality Date   KNEE ARTHROSCOPY WITH MEDIAL PATELLAR FEMORAL LIGAMENT RECONSTRUCTION Left 07/26/2018   Procedure: LEFT KNEE ARTHROSCOPY WITH MEDIAL PATELLAR FEMORAL LIGAMENT RECONSTRUCTION;  Surgeon: Cristy Bonner DASEN, MD;  Location: [facility];  Service: Orthopedics;  Laterality: Left;   LABIOPLASTY Left 04/20/2018   Procedure: LEFT LABIAL REDUCTION;  Surgeon: Edsel Norleen GAILS, MD;  Location: [facility];  Service: Gynecology;  Laterality: Left;   URETERAL REIMPLANTION  2007   left-sided   Family History  Problem Relation Age of Onset   Irritable bowel syndrome Brother    Asthma Brother    Ulcers Maternal Grandmother    Depression Maternal Grandmother    Cancer Maternal Grandmother    Hyperlipidemia Maternal Grandmother    Fibromyalgia Maternal Grandmother    Ulcers Paternal Grandfather    Hypertension Paternal Grandfather    Migraines Mother    Bipolar disorder Mother    Depression Mother    Anxiety disorder Mother    Fibromyalgia Mother    Alcohol abuse Maternal Grandfather    Depression Other    Depression Maternal Aunt     Current Outpatient Medications:    Acetaminophen  (TYLENOL  PO), Take by mouth., Disp: , Rfl:    albuterol  (VENTOLIN  HFA) 108 (90 Base)  MCG/ACT inhaler, Inhale 1-2 puffs into the lungs every 6 (six) hours as needed for wheezing or shortness of breath., Disp: 18 g, Rfl: 0   fluticasone  (FLONASE ) 50 MCG/ACT nasal spray, Place 2 sprays into both nostrils daily., Disp: 16 g, Rfl: 0   ibuprofen  (ADVIL ) 800 MG tablet, Take 1 tablet (800 mg total) by mouth 3 (three) times daily. Take with food, Disp: 21 tablet, Rfl: 0   melatonin 5 MG TABS, Take 5 mg by mouth at bedtime., Disp: , Rfl:    polyethylene glycol powder (GLYCOLAX /MIRALAX ) powder, Take 17 g by mouth daily., Disp: 3350 g, Rfl: 1   azithromycin  (ZITHROMAX ) 250 MG tablet, Take 1 tablet (250 mg total) by mouth daily. Take first 2 tablets together, then 1 every day until finished. (Patient not taking: Reported on 04/07/2024), Disp: 6 tablet, Rfl: 0   Ibuprofen  (ADVIL  PO), Take by mouth., Disp: , Rfl:    metroNIDAZOLE  (FLAGYL ) 500 MG tablet, Take 1 tablet (500 mg total) by mouth 2 (two) times daily. (Patient not taking: Reported on 04/07/2024), Disp: 14 tablet, Rfl: 0   naproxen  (NAPROSYN ) 500 MG tablet, Take 1 tablet (500 mg total) by mouth 2 (two) times daily. (Patient not taking: Reported on 04/07/2024), Disp: 10 tablet, Rfl: 0   norethindrone-ethinyl estradiol -FE (LOESTRIN  FE) 1-20 MG-MCG tablet, Take 1 tablet by mouth daily. (Patient not taking: Reported on  04/07/2024), Disp: 84 tablet, Rfl: 3   pantoprazole  (PROTONIX ) 40 MG tablet, Take 1 tablet (40 mg total) by mouth daily. (Patient not taking: Reported on 04/07/2024), Disp: 14 tablet, Rfl: 0   sucralfate  (CARAFATE ) 1 g tablet, Take 1 tablet (1 g total) by mouth 3 (three) times daily as needed. Dissolve 1 tablet into a glass of water and drink up to 3 times daily as needed (Patient not taking: Reported on 04/07/2024), Disp: 30 tablet, Rfl: 0 Social History: Reviewed -  reports that she quit smoking about 4 years ago. Her smoking use included cigarettes. She has never been exposed to tobacco smoke. She has never used smokeless  tobacco.  Review of Systems:   Constitutional: Negative for fever and chills Eyes: Negative for visual disturbances Respiratory: Negative for shortness of breath, dyspnea Cardiovascular: Negative for chest pain or palpitations  Gastrointestinal: Negative for vomiting, diarrhea and constipation; no abdominal pain Genitourinary: Negative for dysuria and urgency, vaginal irritation or itching Musculoskeletal: Negative for back pain, joint pain, myalgias  Neurological: Negative for dizziness and headaches    Objective Findings:    Physical Examination: Vitals:   04/07/24 1119  BP: 110/74  Pulse: 81   General appearance - well appearing, and in no distress Mental status - alert, oriented to person, place, and time Chest:  Normal respiratory effort Heart - normal rate and regular rhythm Abdomen:  Soft, nontender Pelvic: *** Musculoskeletal:  Normal range of motion without pain Extremities:  No edema    No results found for this or any previous visit (from the past 24 hours).    Assessment & Plan:  A:   *** P:  ***   No follow-ups on file.  Cathlean Ely CNM 04/07/2024 12:02 PM

## 2024-04-07 NOTE — Progress Notes (Addendum)
 [facility] Visit  Patient name: Victoria Key MRN [id number]  Date of birth: 22-May-2001  CC & HPI:  Victoria Key is a 23 y.o. female presenting today for follow-up after treatment of PID. Feeling well today.  Pertinent History Reviewed:  Medical & Surgical Hx:   Past Medical History:  Diagnosis Date   Abdominal pain, recurrent    Asthma    since a baby,ususally with anxiety    Bilateral pes planus    Depression    Diagnosed since 9th grade but feels like she has had it since 6th grade   Diarrhea    Headache(784.0)    Hypermobility syndrome    Diagnosed by [facility]    Migraine    Patellofemoral arthralgia of left knee    Scoliosis    Urinary tract infection    Past Surgical History:  Procedure Laterality Date   KNEE ARTHROSCOPY WITH MEDIAL PATELLAR FEMORAL LIGAMENT RECONSTRUCTION Left 07/26/2018   Procedure: LEFT KNEE ARTHROSCOPY WITH MEDIAL PATELLAR FEMORAL LIGAMENT RECONSTRUCTION;  Surgeon: Cristy Bonner DASEN, MD;  Location: [facility];  Service: Orthopedics;  Laterality: Left;   LABIOPLASTY Left 04/20/2018   Procedure: LEFT LABIAL REDUCTION;  Surgeon: Edsel Norleen GAILS, MD;  Location: [facility];  Service: Gynecology;  Laterality: Left;   URETERAL REIMPLANTION  2007   left-sided   Family History  Problem Relation Age of Onset   Irritable bowel syndrome Brother    Asthma Brother    Ulcers Maternal Grandmother    Depression Maternal Grandmother    Cancer Maternal Grandmother    Hyperlipidemia Maternal Grandmother    Fibromyalgia Maternal Grandmother    Ulcers Paternal Grandfather    Hypertension Paternal Grandfather    Migraines Mother    Bipolar disorder Mother    Depression Mother    Anxiety disorder Mother    Fibromyalgia Mother    Alcohol abuse Maternal Grandfather    Depression Other    Depression Maternal Aunt     Current Outpatient Medications:    Acetaminophen  (TYLENOL  PO), Take by mouth., Disp: , Rfl:    albuterol   (VENTOLIN  HFA) 108 (90 Base) MCG/ACT inhaler, Inhale 1-2 puffs into the lungs every 6 (six) hours as needed for wheezing or shortness of breath., Disp: 18 g, Rfl: 0   fluconazole  (DIFLUCAN ) 150 MG tablet, 1 po stat; repeat in 3 days, Disp: 2 tablet, Rfl: 2   fluticasone  (FLONASE ) 50 MCG/ACT nasal spray, Place 2 sprays into both nostrils daily., Disp: 16 g, Rfl: 0   ibuprofen  (ADVIL ) 800 MG tablet, Take 1 tablet (800 mg total) by mouth 3 (three) times daily. Take with food, Disp: 21 tablet, Rfl: 0   melatonin 5 MG TABS, Take 5 mg by mouth at bedtime., Disp: , Rfl:    polyethylene glycol powder (GLYCOLAX /MIRALAX ) powder, Take 17 g by mouth daily., Disp: 3350 g, Rfl: 1   azithromycin  (ZITHROMAX ) 250 MG tablet, Take 1 tablet (250 mg total) by mouth daily. Take first 2 tablets together, then 1 every day until finished. (Patient not taking: Reported on 04/07/2024), Disp: 6 tablet, Rfl: 0   Ibuprofen  (ADVIL  PO), Take by mouth., Disp: , Rfl:    metroNIDAZOLE  (FLAGYL ) 500 MG tablet, Take 1 tablet (500 mg total) by mouth 2 (two) times daily. (Patient not taking: Reported on 04/07/2024), Disp: 14 tablet, Rfl: 0   naproxen  (NAPROSYN ) 500 MG tablet, Take 1 tablet (500 mg total) by mouth 2 (two) times daily. (Patient not taking: Reported on 04/07/2024),  Disp: 10 tablet, Rfl: 0   norethindrone-ethinyl estradiol -FE (LOESTRIN  FE) 1-20 MG-MCG tablet, Take 1 tablet by mouth daily. (Patient not taking: Reported on 04/07/2024), Disp: 84 tablet, Rfl: 3   pantoprazole  (PROTONIX ) 40 MG tablet, Take 1 tablet (40 mg total) by mouth daily. (Patient not taking: Reported on 04/07/2024), Disp: 14 tablet, Rfl: 0   sucralfate  (CARAFATE ) 1 g tablet, Take 1 tablet (1 g total) by mouth 3 (three) times daily as needed. Dissolve 1 tablet into a glass of water and drink up to 3 times daily as needed (Patient not taking: Reported on 04/07/2024), Disp: 30 tablet, Rfl: 0 Social History: Reviewed -  reports that she quit smoking about 4 years ago.  Her smoking use included cigarettes. She has never been exposed to tobacco smoke. She has never used smokeless tobacco.  Review of Systems:   Negative unless otherwise noted in HPI  Objective Findings:   Physical Examination: Vitals:   04/07/24 1119  BP: 110/74  Pulse: 81   General appearance: well appearing, and in no distress Mental status: alert, oriented to person, place, and time Chest:  normal respiratory effort Heart: normal rate Abdomen: appears soft, non-distended Pelvic: deferred Musculoskeletal: normal range of motion without pain Extremities: no edema  No results found for this or any previous visit (from the past 24 hours).    Assessment & Plan:  1. PID (acute pelvic inflammatory disease) (Primary) Antibiotic completed, symptoms fully resolved, no longer experiencing any abdominal or pelvic pain  2. Vaginal itching Noticed new symptoms of yeast infection after completing antibiotics Diflucan  ordered - fluconazole  (DIFLUCAN ) 150 MG tablet; 1 po stat; repeat in 3 days  Dispense: 2 tablet; Refill: 2  May follow up as needed.  Victoria Key, SNM 04/07/2024 1:41 PM   The above was performed under my direct supervision and guidance.

## 2024-05-19 DIAGNOSIS — Z0000 Encounter for general adult medical examination without abnormal findings: Secondary | ICD-10-CM | POA: Diagnosis not present

## 2024-06-02 ENCOUNTER — Encounter (INDEPENDENT_AMBULATORY_CARE_PROVIDER_SITE_OTHER): Payer: Self-pay | Admitting: *Deleted

## 2024-06-03 ENCOUNTER — Encounter: Payer: Self-pay | Admitting: *Deleted

## 2024-06-08 DIAGNOSIS — F418 Other specified anxiety disorders: Secondary | ICD-10-CM | POA: Diagnosis not present

## 2024-06-15 DIAGNOSIS — F322 Major depressive disorder, single episode, severe without psychotic features: Secondary | ICD-10-CM | POA: Diagnosis not present

## 2024-06-20 DIAGNOSIS — J302 Other seasonal allergic rhinitis: Secondary | ICD-10-CM | POA: Diagnosis not present

## 2024-06-22 ENCOUNTER — Ambulatory Visit (INDEPENDENT_AMBULATORY_CARE_PROVIDER_SITE_OTHER): Admitting: Gastroenterology

## 2024-06-22 ENCOUNTER — Encounter (INDEPENDENT_AMBULATORY_CARE_PROVIDER_SITE_OTHER): Payer: Self-pay | Admitting: Gastroenterology

## 2024-06-22 VITALS — BP 120/81 | HR 147 | Temp 98.6°F | Ht 61.5 in | Wt 131.4 lb

## 2024-06-22 DIAGNOSIS — R197 Diarrhea, unspecified: Secondary | ICD-10-CM

## 2024-06-22 DIAGNOSIS — R634 Abnormal weight loss: Secondary | ICD-10-CM | POA: Diagnosis not present

## 2024-06-22 DIAGNOSIS — K909 Intestinal malabsorption, unspecified: Secondary | ICD-10-CM | POA: Diagnosis not present

## 2024-06-22 DIAGNOSIS — R1310 Dysphagia, unspecified: Secondary | ICD-10-CM | POA: Diagnosis not present

## 2024-06-22 DIAGNOSIS — K589 Irritable bowel syndrome without diarrhea: Secondary | ICD-10-CM | POA: Diagnosis not present

## 2024-06-22 DIAGNOSIS — R1319 Other dysphagia: Secondary | ICD-10-CM | POA: Insufficient documentation

## 2024-06-22 NOTE — Patient Instructions (Signed)
 It was very nice to meet you today, as dicussed with will plan for the following :  1)Ensure adequate fluid intake: Aim for 8 glasses of water daily. Follow a high fiber diet: Include foods such as dates, prunes, pears, and kiwi. Take Miralax  twice a day for the first week, then reduce to once daily thereafter.  2) Labwork and stool sample  3) Upper endoscopy

## 2024-06-22 NOTE — Progress Notes (Signed)
 Victoria Key , M.D. Gastroenterology & Hepatology [facility] [address] [place], [place] [address] Primary Care Physician: Shona Norleen PEDLAR, MD [address] [name] [address]  Chief Complaint: Diarrhea, weight loss  History of Present Illness: Victoria Key is a 23 y.o. female with anxiety depression, asthma who presents for evaluation of unintentional weight loss, solid and liquid food dysphagia and altered bowel movements.  Patient reports that she almost every day has solid and liquid food dysphagia where she will feel food is going down slowly in her  upper chest and occasional choking episodes.  Patient reports she is going to see ENT soon as well.  Patient reports unintentional weight loss of 10 pounds in a week.  Patient has altered bowel movement with hard liquid stools; abdominal bloating and cramping is not a major complaint..  Bristol stool scale mostly type V and VI bowel movement 1-2 times daily. The patient denies having any nausea, vomiting, fever, chills, hematochezia, melena, hematemesis, abdominal distention, abdominal pain,  jaundice, pruritus  Labs from 03/2024 normal liver enzymes creatinine 0.76 hemoglobin 13.8 platelet 278  Last ZHI:wnwz Last Colonoscopy:none  FHx: neg for any gastrointestinal/liver disease, no malignancies Surgical: no abdominal surgeries   Past Medical History: Past Medical History:  Diagnosis Date   Abdominal pain, recurrent    Asthma    since a baby,ususally with anxiety    Bilateral pes planus    Depression    Diagnosed since 9th grade but feels like she has had it since 6th grade   Diarrhea    Headache(784.0)    Hypermobility syndrome    Diagnosed by [facility]    Migraine    Patellofemoral arthralgia of left knee    Scoliosis    Urinary tract infection     Past Surgical History: Past Surgical History:  Procedure Laterality Date   KNEE ARTHROSCOPY WITH  MEDIAL PATELLAR FEMORAL LIGAMENT RECONSTRUCTION Left 07/26/2018   Procedure: LEFT KNEE ARTHROSCOPY WITH MEDIAL PATELLAR FEMORAL LIGAMENT RECONSTRUCTION;  Surgeon: Cristy Bonner DASEN, MD;  Location: [facility];  Service: Orthopedics;  Laterality: Left;   LABIOPLASTY Left 04/20/2018   Procedure: LEFT LABIAL REDUCTION;  Surgeon: Edsel Norleen GAILS, MD;  Location: [facility];  Service: Gynecology;  Laterality: Left;   URETERAL REIMPLANTION  2007   left-sided    Family History: Family History  Problem Relation Age of Onset   Irritable bowel syndrome Brother    Asthma Brother    Ulcers Maternal Grandmother    Depression Maternal Grandmother    Cancer Maternal Grandmother    Hyperlipidemia Maternal Grandmother    Fibromyalgia Maternal Grandmother    Ulcers Paternal Grandfather    Hypertension Paternal Grandfather    Migraines Mother    Bipolar disorder Mother    Depression Mother    Anxiety disorder Mother    Fibromyalgia Mother    Alcohol abuse Maternal Grandfather    Depression Other    Depression Maternal Aunt     Social History: Social History   Tobacco Use  Smoking Status Former   Current packs/day: 0.00   Types: Cigarettes   Quit date: 07/17/2019   Years since quitting: 4.9   Passive exposure: Never  Smokeless Tobacco Never  Tobacco Comments   mother vapes   Social History   Substance and Sexual Activity  Alcohol Use Yes   Comment: occ   Social History   Substance and Sexual Activity  Drug Use Yes  Types: Marijuana   Comment: weekly    Allergies: Allergies  Allergen Reactions   Latex Rash and Itching    Band aids Band aids   Penicillins Anaphylaxis and Other (See Comments)    Possible serum like sickness vs angioedema  Has patient had a PCN reaction causing immediate rash, facial/tongue/throat swelling, SOB or lightheadedness with hypotension: Yes Has patient had a PCN reaction causing severe rash involving mucus membranes or skin necrosis: No Has  patient had a PCN reaction that required hospitalization: Yes Has patient had a PCN reaction occurring within the last 10 years: Yes If all of the above answers are NO, then may proceed with Cephalosporin use.   Amoxicillin -Pot Clavulanate Other (See Comments)    Possible serum like sickness vs angioedema  Has patient had a PCN reaction causing immediate rash, facial/tongue/throat swelling, SOB or lightheadedness with hypotension: Yes Has patient had a PCN reaction causing severe rash involving mucus membranes or skin necrosis: No Has patient had a PCN reaction that required hospitalization: Yes Has patient had a PCN reaction occurring within the last 10 years: Yes If all of the above answers are NO, then may proceed with Cephalosporin use.  Stroke like reaction   Adhesive [Tape] Rash    Medications: Current Outpatient Medications  Medication Sig Dispense Refill   Acetaminophen  (TYLENOL  PO) Take by mouth. (Patient taking differently: Take by mouth as needed.)     albuterol  (VENTOLIN  HFA) 108 (90 Base) MCG/ACT inhaler Inhale 1-2 puffs into the lungs every 6 (six) hours as needed for wheezing or shortness of breath. 18 g 0   hydrOXYzine  (ATARAX ) 25 MG tablet Take 25 mg by mouth 3 (three) times daily as needed.     ibuprofen  (ADVIL ) 800 MG tablet Take 1 tablet (800 mg total) by mouth 3 (three) times daily. Take with food (Patient taking differently: Take 800 mg by mouth as needed. Take with food) 21 tablet 0   mirtazapine (REMERON) 7.5 MG tablet Take 3.75 mg by mouth at bedtime.     pantoprazole  (PROTONIX ) 40 MG tablet Take 1 tablet (40 mg total) by mouth daily. (Patient taking differently: Take 40 mg by mouth as needed.) 14 tablet 0   norethindrone-ethinyl estradiol -FE (LOESTRIN  FE) 1-20 MG-MCG tablet Take 1 tablet by mouth daily. (Patient not taking: Reported on 06/22/2024) 84 tablet 3   No current facility-administered medications for this visit.    Review of Systems: GENERAL:  negative for malaise, night sweats HEENT: No changes in hearing or vision, no nose bleeds or other nasal problems. NECK: Negative for lumps, goiter, pain and significant neck swelling RESPIRATORY: Negative for cough, wheezing CARDIOVASCULAR: Negative for chest pain, leg swelling, palpitations, orthopnea GI: SEE HPI MUSCULOSKELETAL: Negative for joint pain or swelling, back pain, and muscle pain. SKIN: Negative for lesions, rash HEMATOLOGY Negative for prolonged bleeding, bruising easily, and swollen nodes. ENDOCRINE: Negative for cold or heat intolerance, polyuria, polydipsia and goiter. NEURO: negative for tremor, gait imbalance, syncope and seizures. The remainder of the review of systems is noncontributory.   Physical Exam: BP 120/81 (BP Location: Left Arm, Patient Position: Sitting, Cuff Size: Normal)   Pulse (!) 147   Temp 98.6 F (37 C) (Temporal)   Ht 5' 1.5 (1.562 m)   Wt 131 lb 6.4 oz (59.6 kg)   LMP 05/27/2024 (Approximate)   BMI 24.43 kg/m  GENERAL: The patient is AO x3, in no acute distress. HEENT: Head is normocephalic and atraumatic. EOMI are intact. Mouth is well hydrated and without  lesions. NECK: Supple. No masses LUNGS: Clear to auscultation. No presence of rhonchi/wheezing/rales. Adequate chest expansion HEART: RRR, normal s1 and s2. ABDOMEN: Soft, nontender, no guarding, no peritoneal signs, and nondistended. BS +. No masses.  Imaging/Labs: as above     Latest Ref Rng & Units 03/26/2024   10:57 AM 03/25/2024    7:05 PM 04/14/2022    9:10 AM  CBC  WBC 4.0 - 10.5 K/uL 6.1  9.5  11.0   Hemoglobin 12.0 - 15.0 g/dL 86.1  85.8  84.6   Hematocrit 36.0 - 46.0 % 42.3  43.0  46.6   Platelets 150 - 400 K/uL 248  287  407    No results found for: IRON, TIBC, FERRITIN  I personally reviewed and interpreted the available labs, imaging and endoscopic files.  Impression and Plan:   Nechama N Dress is a 23 y.o. female with anxiety depression, asthma who  presents for evaluation of unintentional weight loss, solid and liquid food dysphagia and altered bowel movements.  # Dysphagia # Unintentional weight loss # Altered bowel movements  Patient has solid and liquid food dysphagia which could be esophageal web or ring or underlying GERD.  Although this is a young patient with unintentional weight loss and history of atopy (Asthma) without upper endoscopy cannot rule out eosinophilic esophagitis  Will obtain IBS panel with celiac profile, alpha gal and fecal calprotectin  Altered bowel movement is most likely underlying constipation CT from 2023 with significant stool burden.  Unlikely this is IBS as abdominal pain and cramping is not a major issue.  Proceed with upper endoscopy with dilation/biopsies  I thoroughly discussed with the patient the procedure, including the risks involved. Patient understands what the procedure involves including the benefits and any risks. Patient understands alternatives to the proposed procedure. Risks including (but not limited to) bleeding, tearing of the lining (perforation), rupture of adjacent organs, problems with heart and lung function, infection, and medication reactions. A small percentage of complications may require surgery, hospitalization, repeat endoscopic procedure, and/or transfusion.  Patient understood and agreed.    All questions were answered.      Saunders Arlington Faizan Zipporah Finamore, MD Gastroenterology and Hepatology [facility]   This chart has been completed using [facility] Dictation software, and while attempts have been made to ensure accuracy , certain words and phrases may not be transcribed as intended

## 2024-06-23 ENCOUNTER — Telehealth: Payer: Self-pay | Admitting: *Deleted

## 2024-06-23 NOTE — Telephone Encounter (Signed)
 LMOVM to call back to schedule EGD/ED with Dr. Cinderella, any room, needs upt prior to procedure

## 2024-06-26 LAB — ALPHA-GAL PANEL
Allergen Lamb IgE: <0.1 kU/L
Beef IgE: <0.1 kU/L
IgE (Immunoglobulin E), Serum: 35 [IU]/mL (ref 6–495)
O215-IgE Alpha-Gal: <0.1 kU/L
Pork IgE: <0.1 kU/L

## 2024-06-26 LAB — CELIAC AB TTG DGP TIGA
Antigliadin Abs, IgA: 9 U (ref 0–19)
Gliadin IgG: 8 U (ref 0–19)
IgA/Immunoglobulin A, Serum: 372 mg/dL — ABNORMAL HIGH (ref 87–352)
Tissue Transglut Ab: 7 U/mL — AB (ref 0–5)
Transglutaminase IgA: <2 U/mL (ref 0–3)

## 2024-06-26 LAB — C-REACTIVE PROTEIN: CRP: <1 mg/L (ref 0–10)

## 2024-06-27 DIAGNOSIS — R1310 Dysphagia, unspecified: Secondary | ICD-10-CM | POA: Diagnosis not present

## 2024-06-27 NOTE — Telephone Encounter (Signed)
 Spoke with patient, scheduled EGD/DIL for 08/01/2024 at 10:00am. Instructions sent to [facility].

## 2024-06-28 LAB — CALPROTECTIN, FECAL: Calprotectin, Fecal: 6 ug/g (ref 0–120)

## 2024-07-04 ENCOUNTER — Ambulatory Visit (INDEPENDENT_AMBULATORY_CARE_PROVIDER_SITE_OTHER): Payer: Self-pay | Admitting: Gastroenterology

## 2024-07-04 NOTE — Progress Notes (Signed)
 Labs with weakly positive TTG IgA concerning for celiac disease (7)  Fecal calprotectin 6 CRP negative Alpha gal negative  Patient is already scheduled for upper endoscopy for dysphagia, will also plan for celiac protocol biopsies during the same procedure

## 2024-07-26 ENCOUNTER — Ambulatory Visit (INDEPENDENT_AMBULATORY_CARE_PROVIDER_SITE_OTHER): Payer: Self-pay | Admitting: Gastroenterology

## 2024-07-26 ENCOUNTER — Other Ambulatory Visit (HOSPITAL_COMMUNITY)
Admission: RE | Admit: 2024-07-26 | Discharge: 2024-07-26 | Disposition: A | Discharge status: Home / Self Care | Source: Ambulatory Visit | Attending: Gastroenterology | Admitting: Gastroenterology

## 2024-07-26 ENCOUNTER — Other Ambulatory Visit: Payer: Self-pay | Admitting: *Deleted

## 2024-07-26 DIAGNOSIS — R634 Abnormal weight loss: Secondary | ICD-10-CM | POA: Insufficient documentation

## 2024-07-26 DIAGNOSIS — R197 Diarrhea, unspecified: Secondary | ICD-10-CM | POA: Insufficient documentation

## 2024-07-26 DIAGNOSIS — K909 Intestinal malabsorption, unspecified: Secondary | ICD-10-CM | POA: Insufficient documentation

## 2024-07-26 DIAGNOSIS — K589 Irritable bowel syndrome without diarrhea: Secondary | ICD-10-CM

## 2024-07-26 DIAGNOSIS — R1319 Other dysphagia: Secondary | ICD-10-CM | POA: Insufficient documentation

## 2024-07-26 LAB — PREGNANCY, URINE: Preg Test, Ur: POSITIVE — AB

## 2024-07-28 ENCOUNTER — Ambulatory Visit (INDEPENDENT_AMBULATORY_CARE_PROVIDER_SITE_OTHER): Admitting: *Deleted

## 2024-07-28 ENCOUNTER — Telehealth: Payer: Self-pay

## 2024-07-28 DIAGNOSIS — R3989 Other symptoms and signs involving the genitourinary system: Secondary | ICD-10-CM | POA: Diagnosis not present

## 2024-07-28 DIAGNOSIS — R350 Frequency of micturition: Secondary | ICD-10-CM | POA: Diagnosis not present

## 2024-07-28 LAB — POCT URINALYSIS DIPSTICK OB
Blood, UA: NEGATIVE
Glucose, UA: NEGATIVE
Ketones, UA: NEGATIVE
Leukocytes, UA: NEGATIVE
Nitrite, UA: NEGATIVE
POC,PROTEIN,UA: NEGATIVE

## 2024-07-28 NOTE — Progress Notes (Signed)
   NURSE VISIT- UTI SYMPTOMS   SUBJECTIVE:  Victoria Key is a 23 y.o. [id number] female here for UTI symptoms. She is [redacted]w[redacted]d pregnant. She reports stinging pain at urethra like something poking, frequency, lower back pain that comes and goes. Denies bleeding, discharge, odor   OBJECTIVE:  LMP 06/26/2024   Appears well, in no apparent distress  Results for orders placed or performed in visit on 07/28/24 (from the past 24 hours)  POC Urinalysis Dipstick OB   Collection Time: 07/28/24  4:34 PM  Result Value Ref Range   Color, UA     Clarity, UA     Glucose, UA Negative Negative   Bilirubin, UA     Ketones, UA neg    Spec Grav, UA     Blood, UA neg    pH, UA     POC,PROTEIN,UA Negative Negative, Trace, Small (1+), Moderate (2+), Large (3+), 4+   Urobilinogen, UA     Nitrite, UA neg    Leukocytes, UA Negative Negative   Appearance     Odor      ASSESSMENT: Pregnancy [redacted]w[redacted]d with UTI symptoms and negative nitrites  PLAN: Discussed with Delon Lewis, NP   Rx sent by provider today: No Urine culture sent Call or return to clinic prn if these symptoms worsen or fail to improve as anticipated. Follow-up: as scheduled   Rutherford Rover  07/28/2024 4:49 PM

## 2024-07-28 NOTE — Telephone Encounter (Signed)
 Returned patient's call.  Patient with known history of UTI's.  Has burning at urethra and mild lower back pain.  Will have patient come in for urine check this afternoon.

## 2024-07-28 NOTE — Telephone Encounter (Signed)
 3-4 days ago, started stinging at her pee hole, not while peeing just periodically. Please advise.

## 2024-07-29 LAB — URINALYSIS, ROUTINE W REFLEX MICROSCOPIC
Bilirubin, UA: NEGATIVE
Glucose, UA: NEGATIVE
Ketones, UA: NEGATIVE
Leukocytes,UA: NEGATIVE
Nitrite, UA: NEGATIVE
Protein,UA: NEGATIVE
RBC, UA: NEGATIVE
Specific Gravity, UA: 1.028 (ref 1.005–1.030)
Urobilinogen, Ur: 0.2 mg/dL (ref 0.2–1.0)
pH, UA: 5.5 (ref 5.0–7.5)

## 2024-07-30 LAB — URINE CULTURE: Organism ID, Bacteria: NO GROWTH

## 2024-08-01 ENCOUNTER — Ambulatory Visit (HOSPITAL_COMMUNITY): Admission: RE | Admit: 2024-08-01 | Admitting: Gastroenterology

## 2024-08-01 ENCOUNTER — Ambulatory Visit: Payer: Self-pay | Admitting: Adult Health

## 2024-08-01 ENCOUNTER — Encounter (HOSPITAL_COMMUNITY): Admission: RE | Payer: Self-pay | Source: Home / Self Care

## 2024-08-01 SURGERY — EGD (ESOPHAGOGASTRODUODENOSCOPY)
Anesthesia: Choice

## 2024-08-04 ENCOUNTER — Ambulatory Visit
Admission: EM | Admit: 2024-08-04 | Discharge: 2024-08-04 | Disposition: A | Discharge status: Home / Self Care | Attending: Family Medicine | Admitting: Family Medicine

## 2024-08-04 DIAGNOSIS — H66001 Acute suppurative otitis media without spontaneous rupture of ear drum, right ear: Secondary | ICD-10-CM | POA: Diagnosis not present

## 2024-08-04 DIAGNOSIS — J069 Acute upper respiratory infection, unspecified: Secondary | ICD-10-CM

## 2024-08-04 MED ORDER — AZITHROMYCIN 250 MG PO TABS: ORAL_TABLET | ORAL | 0 refills | Status: DC

## 2024-08-04 NOTE — ED Provider Notes (Signed)
 [facility]    CSN: [id number] Arrival date & time: 08/04/24  1732      History   Chief Complaint Chief Complaint  Patient presents with   Otalgia    HPI Victoria Key is a 23 y.o. female.   Patient presenting today with about a week of cold-like symptoms, congestion, cough, sore throat and now today started having progressively worsening right ear pain additionally.  Denies fever, chills, chest pain, shortness of breath, abdominal pain, vomiting, diarrhea.  So far not trying anything over-the-counter for symptoms as she is [redacted] weeks pregnant and is unsure what is safe to take in pregnancy.    Past Medical History:  Diagnosis Date   Abdominal pain, recurrent    Asthma    since a baby,ususally with anxiety    Bilateral pes planus    Depression    Diagnosed since 9th grade but feels like she has had it since 6th grade   Diarrhea    Headache(784.0)    Hypermobility syndrome    Diagnosed by [facility]    Migraine    Patellofemoral arthralgia of left knee    Scoliosis    Urinary tract infection     Patient Active Problem List   Diagnosis Date Noted   Diarrhea due to malabsorption 06/22/2024   Irritable bowel syndrome 06/22/2024   Esophageal dysphagia 06/22/2024   Weight loss, abnormal 06/22/2024   Dysmenorrhea 03/30/2024   PID (acute pelvic inflammatory disease) 03/30/2024   ASCUS of cervix with negative high risk HPV 11/03/2023   History of ovarian cyst 10/28/2023   Pelvic pain 10/28/2023   Routine Papanicolaou smear 10/28/2023   Encounter for surveillance of contraceptive pills 09/03/2020   Abdominal cramping 09/03/2020   RLQ abdominal pain 05/18/2020   Encounter for initial prescription of contraceptive pills 05/18/2020   Pregnancy examination or test, negative result 05/18/2020   Screening examination for STD (sexually transmitted disease) 10/10/2019   History of trichomoniasis 10/10/2019   Trichomonas infection 08/30/2019   Body mass  index (BMI) of 30.0 to 30.9 in adult 06/29/2019   Slow transit constipation 10/18/2018   Menstrual migraine without status migrainosus, not intractable 03/30/2018   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   Car sickness 06/11/2017   Chronic generalized pain 06/11/2017   GERD (gastroesophageal reflux disease) 11/07/2016   Adolescent depression 11/07/2016   Scoliosis deformity of spine 11/07/2016   Acne vulgaris 10/27/2016   Premenstrual dysphoric syndrome 10/27/2016   Dysmenorrhea in adolescent 10/27/2016   Anxiety state 12/16/2013   Migraine without aura and without status migrainosus, not intractable 10/17/2013   Tension headache 10/17/2013    Past Surgical History:  Procedure Laterality Date   KNEE ARTHROSCOPY WITH MEDIAL PATELLAR FEMORAL LIGAMENT RECONSTRUCTION Left 07/26/2018   Procedure: LEFT KNEE ARTHROSCOPY WITH MEDIAL PATELLAR FEMORAL LIGAMENT RECONSTRUCTION;  Surgeon: Cristy Bonner DASEN, MD;  Location: [facility];  Service: Orthopedics;  Laterality: Left;   LABIOPLASTY Left 04/20/2018   Procedure: LEFT LABIAL REDUCTION;  Surgeon: Edsel Norleen GAILS, MD;  Location: [facility];  Service: Gynecology;  Laterality: Left;   URETERAL REIMPLANTION  2007   left-sided    OB History     Gravida  1   Para  0   Term  0   Preterm  0   AB  0   Living  0      SAB  0   IAB  0   Ectopic  0   Multiple  0  Live Births  0            Home Medications    Prior to Admission medications   Medication Sig Start Date End Date Taking? Authorizing Provider  azithromycin  (ZITHROMAX ) 250 MG tablet Take first 2 tablets together, then 1 every day until finished. 08/04/24  Yes Stuart Vernell Norris, PA-C  Acetaminophen  (TYLENOL  PO) Take by mouth.    [provider]  albuterol  (VENTOLIN  HFA) 108 (90 Base) MCG/ACT inhaler Inhale 1-2 puffs into the lungs every 6 (six) hours as needed for wheezing or shortness of breath. 11/27/22   Chandra Harlene LABOR, NP   hydrOXYzine  (ATARAX ) 25 MG tablet Take 25 mg by mouth 3 (three) times daily as needed. 05/27/24   [provider]  mirtazapine (REMERON) 7.5 MG tablet Take 3.75 mg by mouth at bedtime. 06/15/24   [provider]  pantoprazole  (PROTONIX ) 40 MG tablet Take 1 tablet (40 mg total) by mouth daily. 03/02/24   Stuart Vernell Norris, PA-C  Prenatal Vit-Fe Fumarate-FA (PRENATAL VITAMIN PO) Take by mouth.    [provider]    Family History Family History  Problem Relation Age of Onset   Irritable bowel syndrome Brother    Asthma Brother    Ulcers Maternal Grandmother    Depression Maternal Grandmother    Cancer Maternal Grandmother    Hyperlipidemia Maternal Grandmother    Fibromyalgia Maternal Grandmother    Ulcers Paternal Grandfather    Hypertension Paternal Grandfather    Migraines Mother    Bipolar disorder Mother    Depression Mother    Anxiety disorder Mother    Fibromyalgia Mother    Alcohol abuse Maternal Grandfather    Depression Other    Depression Maternal Aunt     Social History Social History   Tobacco Use   Smoking status: Former    Current packs/day: 0.00    Types: Cigarettes    Quit date: 07/17/2019    Years since quitting: 5.0    Passive exposure: Never   Smokeless tobacco: Never   Tobacco comments:    mother vapes  Vaping Use   Vaping status: Every Day   Start date: 09/15/2017   Substances: Nicotine, THC, Flavoring  Substance Use Topics   Alcohol use: Yes    Comment: occ   Drug use: Yes    Types: Marijuana    Comment: weekly     Allergies   Latex, Penicillins, Amoxicillin -pot clavulanate, and Adhesive [tape]   Review of Systems Review of Systems PER HPI  Physical Exam Triage Vital Signs ED Triage Vitals  Encounter Vitals Group     BP 08/04/24 1741 136/81     Girls Systolic BP Percentile --      Girls Diastolic BP Percentile --      Boys Systolic BP Percentile --      Boys Diastolic BP Percentile --      Pulse  Rate 08/04/24 1741 73     Resp 08/04/24 1741 20     Temp 08/04/24 1741 98.7 F (37.1 C)     Temp Source 08/04/24 1741 Oral     SpO2 08/04/24 1741 98 %     Weight --      Height --      Head Circumference --      Peak Flow --      Pain Score 08/04/24 1742 6     Pain Loc --      Pain Education --      Exclude  from Growth Chart --    No data found.  Updated Vital Signs BP 136/81 (BP Location: Right Arm)   Pulse 73   Temp 98.7 F (37.1 C) (Oral)   Resp 20   LMP 06/26/2024   SpO2 98%   Visual Acuity Right Eye Distance:   Left Eye Distance:   Bilateral Distance:    Right Eye Near:   Left Eye Near:    Bilateral Near:     Physical Exam Vitals and nursing note reviewed.  Constitutional:      Appearance: Normal appearance.  HENT:     Head: Atraumatic.     Right Ear: External ear normal.     Left Ear: Tympanic membrane and external ear normal.     Ears:     Comments: Right TM erythematous and edematous    Nose: Rhinorrhea present.     Mouth/Throat:     Mouth: Mucous membranes are moist.     Pharynx: No posterior oropharyngeal erythema.  Eyes:     Extraocular Movements: Extraocular movements intact.     Conjunctiva/sclera: Conjunctivae normal.  Cardiovascular:     Rate and Rhythm: Normal rate and regular rhythm.     Heart sounds: Normal heart sounds.  Pulmonary:     Effort: Pulmonary effort is normal.     Breath sounds: Normal breath sounds. No wheezing or rales.  Musculoskeletal:        General: Normal range of motion.     Cervical back: Normal range of motion and neck supple.  Skin:    General: Skin is warm and dry.  Neurological:     Mental Status: She is alert and oriented to person, place, and time.  Psychiatric:        Mood and Affect: Mood normal.        Thought Content: Thought content normal.      UC Treatments / Results  Labs (all labs ordered are listed, but only abnormal results are displayed) Labs Reviewed - No data to  display  EKG   Radiology No results found.  Procedures Procedures (including critical care time)  Medications Ordered in UC Medications - No data to display  Initial Impression / Assessment and Plan / UC Course  I have reviewed the triage vital signs and the nursing notes.  Pertinent labs & imaging results that were available during my care of the patient were reviewed by me and considered in my medical decision making (see chart for details).     Suspect viral respiratory infection causing a right ear infection.  Will treat with Zithromax  and a list of safe medications over-the-counter in pregnancy given for symptomatic benefit as needed.  Discussed supportive over-the-counter medications, home care and return precautions.  Final Clinical Impressions(s) / UC Diagnoses   Final diagnoses:  Viral URI with cough  Acute suppurative otitis media of right ear without spontaneous rupture of tympanic membrane, recurrence not specified     Discharge Instructions      In addition to the prescribed antibiotic, you may take antihistamine such as Zyrtec , Flonase  nasal spray, Delsym cough syrup, plain Mucinex , Tylenol , use saline sinus rinses and humidifiers.  Follow-up for worsening or unresolving symptoms    ED Prescriptions     Medication Sig Dispense Auth. Provider   azithromycin  (ZITHROMAX ) 250 MG tablet Take first 2 tablets together, then 1 every day until finished. 6 tablet Stuart Vernell Norris, [place]      PDMP not reviewed this encounter.   Stuart Vernell Norris,  PA-C 08/04/24 1824

## 2024-08-04 NOTE — ED Triage Notes (Signed)
 Pt reports she has right ear pain, cough with flem sore lungs, and headache x 1 week

## 2024-08-04 NOTE — Discharge Instructions (Addendum)
 In addition to the prescribed antibiotic, you may take antihistamine such as Zyrtec , Flonase  nasal spray, Delsym cough syrup, plain Mucinex , Tylenol , use saline sinus rinses and humidifiers.  Follow-up for worsening or unresolving symptoms

## 2024-08-16 ENCOUNTER — Other Ambulatory Visit: Payer: Self-pay | Admitting: Obstetrics & Gynecology

## 2024-08-16 DIAGNOSIS — O3680X0 Pregnancy with inconclusive fetal viability, not applicable or unspecified: Secondary | ICD-10-CM

## 2024-08-16 DIAGNOSIS — Z3A08 8 weeks gestation of pregnancy: Secondary | ICD-10-CM

## 2024-08-22 ENCOUNTER — Other Ambulatory Visit: Payer: Self-pay

## 2024-08-22 ENCOUNTER — Ambulatory Visit: Admitting: Radiology

## 2024-08-22 ENCOUNTER — Encounter: Payer: Self-pay | Admitting: Radiology

## 2024-08-22 DIAGNOSIS — Z3A08 8 weeks gestation of pregnancy: Secondary | ICD-10-CM | POA: Diagnosis not present

## 2024-08-22 DIAGNOSIS — Z3687 Encounter for antenatal screening for uncertain dates: Secondary | ICD-10-CM | POA: Diagnosis not present

## 2024-08-22 DIAGNOSIS — O3680X0 Pregnancy with inconclusive fetal viability, not applicable or unspecified: Secondary | ICD-10-CM

## 2024-08-22 NOTE — Progress Notes (Signed)
 US : 8+1 weeks by LMP:  Anteflexed uterus with single viable early IUP.  GS intact within upper mid uterine cavity CRL = 15.4 mm = 7+6 weeks,  Yolk Sac = 5.0 mm, FHR = 173 bpm Normal Left ov, Rt ov not seen  -  Neg adnexal regions - neg CDS - no free fluid present, EDD by LMP = 04-02-25

## 2024-08-23 ENCOUNTER — Encounter: Payer: Self-pay | Admitting: Women's Health

## 2024-08-23 ENCOUNTER — Other Ambulatory Visit: Payer: Self-pay | Admitting: Women's Health

## 2024-08-23 MED ORDER — DOXYLAMINE-PYRIDOXINE 10-10 MG PO TBEC: DELAYED_RELEASE_TABLET | ORAL | 6 refills | Status: DC

## 2024-08-23 MED ORDER — PROMETHAZINE HCL 25 MG PO TABS: 12.5000 mg | ORAL_TABLET | Freq: Four times a day (QID) | ORAL | 6 refills | Status: DC | PRN

## 2024-08-30 DIAGNOSIS — F418 Other specified anxiety disorders: Secondary | ICD-10-CM | POA: Diagnosis not present

## 2024-08-30 DIAGNOSIS — Z3A09 9 weeks gestation of pregnancy: Secondary | ICD-10-CM | POA: Diagnosis not present

## 2024-08-30 DIAGNOSIS — O99891 Other specified diseases and conditions complicating pregnancy: Secondary | ICD-10-CM | POA: Diagnosis not present

## 2024-08-30 DIAGNOSIS — J069 Acute upper respiratory infection, unspecified: Secondary | ICD-10-CM | POA: Diagnosis not present

## 2024-08-30 DIAGNOSIS — Z20822 Contact with and (suspected) exposure to covid-19: Secondary | ICD-10-CM | POA: Diagnosis not present

## 2024-09-13 ENCOUNTER — Other Ambulatory Visit: Payer: Self-pay | Admitting: Women's Health

## 2024-09-13 DIAGNOSIS — U071 COVID-19: Secondary | ICD-10-CM | POA: Diagnosis not present

## 2024-09-13 DIAGNOSIS — Z331 Pregnant state, incidental: Secondary | ICD-10-CM | POA: Diagnosis not present

## 2024-09-13 DIAGNOSIS — R059 Cough, unspecified: Secondary | ICD-10-CM | POA: Diagnosis not present

## 2024-09-13 MED ORDER — MIRTAZAPINE 7.5 MG PO TABS: 3.7500 mg | ORAL_TABLET | Freq: Every day | ORAL | 2 refills | Status: AC

## 2024-09-20 ENCOUNTER — Ambulatory Visit: Admitting: Women's Health

## 2024-09-20 ENCOUNTER — Encounter: Payer: Self-pay | Admitting: Women's Health

## 2024-09-20 ENCOUNTER — Encounter: Admitting: *Deleted

## 2024-09-20 ENCOUNTER — Other Ambulatory Visit (HOSPITAL_COMMUNITY)
Admission: RE | Admit: 2024-09-20 | Discharge: 2024-09-20 | Disposition: A | Discharge status: Home / Self Care | Source: Ambulatory Visit | Attending: Women's Health | Admitting: Women's Health

## 2024-09-20 VITALS — BP 122/79 | HR 93 | Wt 134.0 lb

## 2024-09-20 DIAGNOSIS — Z3A12 12 weeks gestation of pregnancy: Secondary | ICD-10-CM | POA: Diagnosis present

## 2024-09-20 DIAGNOSIS — Z3401 Encounter for supervision of normal first pregnancy, first trimester: Secondary | ICD-10-CM

## 2024-09-20 DIAGNOSIS — Z3400 Encounter for supervision of normal first pregnancy, unspecified trimester: Secondary | ICD-10-CM | POA: Insufficient documentation

## 2024-09-20 DIAGNOSIS — F418 Other specified anxiety disorders: Secondary | ICD-10-CM | POA: Diagnosis not present

## 2024-09-20 DIAGNOSIS — Z113 Encounter for screening for infections with a predominantly sexual mode of transmission: Secondary | ICD-10-CM

## 2024-09-20 DIAGNOSIS — O99341 Other mental disorders complicating pregnancy, first trimester: Secondary | ICD-10-CM | POA: Diagnosis not present

## 2024-09-20 DIAGNOSIS — Z131 Encounter for screening for diabetes mellitus: Secondary | ICD-10-CM

## 2024-09-20 NOTE — Progress Notes (Signed)
 "   INITIAL OBSTETRICAL VISIT Patient name: Victoria Key MRN [id number]  Date of birth: 01/30/2001 Chief Complaint:   Initial Prenatal Visit  History of Present Illness:   Victoria Key is a 24 y.o. G45P0000 Caucasian female at [redacted]w[redacted]d by LMP c/w u/s at 7 weeks with an Estimated Date of Delivery: 04/02/25 being seen today for her initial obstetrical visit.   Patient's last menstrual period was 06/26/2024. Her obstetrical history is significant for primigravida.   Interested in waterbirth Dep/anx- on remeron  and vistaril  (rx'd by psychiatrist online, hasn't been able to reach them after multiple attempts, requests referral to local psychiatrist) Today she reports no complaints.  Last pap 10/28/23. Results were: ASCUS w/ HRHPV negative     09/20/2024    3:26 PM 02/28/2020    3:09 PM 11/24/2019    3:04 PM 08/29/2019    1:30 PM  Depression screen PHQ 2/9  Decreased Interest 2 1 0 1  Down, Depressed, Hopeless 0 2 0 2  PHQ - 2 Score 2 3 0 3  Altered sleeping 3 3  3   Tired, decreased energy 2 3  3   Change in appetite 2 3  3   Feeling bad or failure about yourself  1 3  2   Trouble concentrating 0 3  0  Moving slowly or fidgety/restless 0 1  0  Suicidal thoughts 0 0    PHQ-9 Score 10 19   14       Data saved with a previous flowsheet row definition        09/20/2024    3:27 PM 08/29/2019    1:30 PM  GAD 7 : Generalized Anxiety Score  Nervous, Anxious, on Edge 1 2  Control/stop worrying 1 3  Worry too much - different things 1 2  Trouble relaxing 1 2  Restless 0 0  Easily annoyed or irritable 3 2  Afraid - awful might happen 1 2  Total GAD 7 Score 8 13     Review of Systems:   Pertinent items are noted in HPI Denies cramping/contractions, leakage of fluid, vaginal bleeding, abnormal vaginal discharge w/ itching/odor/irritation, headaches, visual changes, shortness of breath, chest pain, abdominal pain, severe nausea/vomiting, or problems with urination or bowel movements unless  otherwise stated above.  Pertinent History Reviewed:  Reviewed past medical,surgical, social, obstetrical and family history.  Reviewed problem list, medications and allergies. OB History  Gravida Para Term Preterm AB Living  1 0 0 0 0 0  SAB IAB Ectopic Multiple Live Births  0 0 0 0 0    # Outcome Date GA Lbr Len/2nd Weight Sex Type Anes PTL Lv  1 Current            Physical Assessment:   Vitals:   09/20/24 1529  BP: 122/79  Pulse: 93  Weight: 134 lb (60.8 kg)  Body mass index is 24.91 kg/m.       Physical Examination:  General appearance - well appearing, and in no distress  Mental status - alert, oriented to person, place, and time  Psych:  She has a normal mood and affect  Skin - warm and dry, normal color, no suspicious lesions noted  Chest - effort normal, all lung fields clear to auscultation bilaterally  Heart - normal rate and regular rhythm  Abdomen - soft, nontender  Extremities:  No swelling or varicosities noted  Thin prep pap is not done  Chaperone: N/A  TODAY'S informal TA u/s: +FCA and active fetus  No  results found for this or any previous visit (from the past 24 hours).  Assessment & Plan:  1) Low-Risk Pregnancy G1P0000 at [redacted]w[redacted]d with an Estimated Date of Delivery: 04/02/25   2) Initial OB visit  3) Dep/anx> doing well on remeron  and vistaril  (previously rx'd by psychiatrist online, unable to reach them after multiple attempts, requests referral to local pyschiatrist). Referral placed  4) Interested in waterbirth> discussed briefly and gave printed info  Meds: No orders of the defined types were placed in this encounter.   Initial labs obtained Continue prenatal vitamins Reviewed n/v relief measures and warning s/s to report Reviewed recommended weight gain based on pre-gravid BMI Encouraged well-balanced diet Genetic & carrier screening discussed: requests Panorama, AFP, and Horizon  Ultrasound discussed; fetal survey: requested CCNC  completed> form faxed if has or is planning to apply for medicaid The nature of [place] [facility] [employer] with multiple MDs and other Advanced Practice Providers was explained to patient; also emphasized that fellows, residents, and students are part of our team. Does not have home bp cuff. Office bp cuff given: yes. Rx sent: no. Check bp weekly, let us  know if consistently >140/90.   Follow-up: Return in about 4 weeks (around 10/18/2024) for LROB, AFP, CNM, in person; then @ 20w for anatomy u/s, LROB w/ pap.   Orders Placed This Encounter  Procedures   Urine Culture   PANORAMA PRENATAL TEST   HORIZON CUSTOM   Hemoglobin A1c   CBC/D/Plt+RPR+Rh+ABO+RubIgG...   Ambulatory referral to [facility]    Suzen JONELLE Fetters CNM, [facility] 09/20/2024 3:57 PM  "

## 2024-09-20 NOTE — Patient Instructions (Addendum)
 Victoria Key, thank you for choosing our office today! We appreciate the opportunity to meet your healthcare needs. You may receive a short survey by mail, e-mail, or through [employer]. If you are happy with your care we would appreciate if you could take just a few minutes to complete the survey questions. We read all of your comments and take your feedback very seriously. Thank you again for choosing our office.  [facility] [employer] Healthcare Team at [facility]  [facility] at [facility] ([address] [place], [place] [address]) Entrance C, located off of [address] Free 24/7 valet parking   Nausea & Vomiting Have saltine crackers or pretzels by your bed and eat a few bites before you raise your head out of bed in the morning Eat small frequent meals throughout the day instead of large meals Drink plenty of fluids throughout the day to stay hydrated, just don't drink a lot of fluids with your meals.  This can make your stomach fill up faster making you feel sick Do not brush your teeth right after you eat Products with real ginger are good for nausea, like ginger ale and ginger hard candy Make sure it says made with real ginger! Sucking on sour candy like lemon heads is also good for nausea If your prenatal vitamins make you nauseated, take them at night so you will sleep through the nausea Sea Bands If you feel like you need medicine for the nausea & vomiting please let us  know If you are unable to keep any fluids or food down please let us  know   Constipation Drink plenty of fluid, preferably water, throughout the day Eat foods high in fiber such as fruits, vegetables, and grains Exercise, such as walking, is a good way to keep your bowels regular Drink warm fluids, especially warm prune juice, or decaf coffee Eat a 1/2 cup of real oatmeal (not instant), 1/2 cup applesauce, and 1/2-1 cup warm prune juice every day If needed, you may take Colace (docusate sodium) stool softener  once or twice a day to help keep the stool soft.  If you still are having problems with constipation, you may take Miralax  once daily as needed to help keep your bowels regular.   Home Blood Pressure Monitoring for Patients   Your provider has recommended that you check your blood pressure (BP) at least once a week at home. If you do not have a blood pressure cuff at home, one will be provided for you. Contact your provider if you have not received your monitor within 1 week.   Helpful Tips for Accurate Home Blood Pressure Checks  Don't smoke, exercise, or drink caffeine 30 minutes before checking your BP Use the restroom before checking your BP (a full bladder can raise your pressure) Relax in a comfortable upright chair Feet on the ground Left arm resting comfortably on a flat surface at the level of your heart Legs uncrossed Back supported Sit quietly and don't talk Place the cuff on your bare arm Adjust snuggly, so that only two fingertips can fit between your skin and the top of the cuff Check 2 readings separated by at least one minute Keep a log of your BP readings For a visual, please reference this diagram: http://ccnc.care/bpdiagram  Provider Name: [facility]     Phone: [phone]  Zone 1: ALL CLEAR  Continue to monitor your symptoms:  BP reading is less than 140 (top number) or less than 90 (bottom  number)  No right upper stomach pain No headaches or seeing spots No feeling nauseated or throwing up No swelling in face and hands  Zone 2: CAUTION Call your doctor's office for any of the following:  BP reading is greater than 140 (top number) or greater than 90 (bottom number)  Stomach pain under your ribs in the middle or right side Headaches or seeing spots Feeling nauseated or throwing up Swelling in face and hands  Zone 3: EMERGENCY  Seek immediate medical care if you have any of the following:  BP reading is greater than160 (top number) or greater than  110 (bottom number) Severe headaches not improving with Tylenol  Serious difficulty catching your breath Any worsening symptoms from Zone 2    First Trimester of Pregnancy The first trimester of pregnancy is from week 1 until the end of week 12 (months 1 through 3). A week after a sperm fertilizes an egg, the egg will implant on the wall of the uterus. This embryo will begin to develop into a baby. Genes from you and your partner are forming the baby. The female genes determine whether the baby is a boy or a girl. At 6-8 weeks, the eyes and face are formed, and the heartbeat can be seen on ultrasound. At the end of 12 weeks, all the baby's organs are formed.  Now that you are pregnant, you will want to do everything you can to have a healthy baby. Two of the most important things are to get good prenatal care and to follow your health care provider's instructions. Prenatal care is all the medical care you receive before the baby's birth. This care will help prevent, find, and treat any problems during the pregnancy and childbirth. BODY CHANGES Your body goes through many changes during pregnancy. The changes vary from woman to woman.  You may gain or lose a couple of pounds at first. You may feel sick to your stomach (nauseous) and throw up (vomit). If the vomiting is uncontrollable, call your health care provider. You may tire easily. You may develop headaches that can be relieved by medicines approved by your health care provider. You may urinate more often. Painful urination may mean you have a bladder infection. You may develop heartburn as a result of your pregnancy. You may develop constipation because certain hormones are causing the muscles that push waste through your intestines to slow down. You may develop hemorrhoids or swollen, bulging veins (varicose veins). Your breasts may begin to grow larger and become tender. Your nipples may stick out more, and the tissue that surrounds them  (areola) may become darker. Your gums may bleed and may be sensitive to brushing and flossing. Dark spots or blotches (chloasma, mask of pregnancy) may develop on your face. This will likely fade after the baby is born. Your menstrual periods will stop. You may have a loss of appetite. You may develop cravings for certain kinds of food. You may have changes in your emotions from day to day, such as being excited to be pregnant or being concerned that something may go wrong with the pregnancy and baby. You may have more vivid and strange dreams. You may have changes in your hair. These can include thickening of your hair, rapid growth, and changes in texture. Some women also have hair loss during or after pregnancy, or hair that feels dry or thin. Your hair will most likely return to normal after your baby is born. WHAT TO EXPECT AT YOUR PRENATAL  VISITS During a routine prenatal visit: You will be weighed to make sure you and the baby are growing normally. Your blood pressure will be taken. Your abdomen will be measured to track your baby's growth. The fetal heartbeat will be listened to starting around week 10 or 12 of your pregnancy. Test results from any previous visits will be discussed. Your health care provider may ask you: How you are feeling. If you are feeling the baby move. If you have had any abnormal symptoms, such as leaking fluid, bleeding, severe headaches, or abdominal cramping. If you have any questions. Other tests that may be performed during your first trimester include: Blood tests to find your blood type and to check for the presence of any previous infections. They will also be used to check for low iron levels (anemia) and Rh antibodies. Later in the pregnancy, blood tests for diabetes will be done along with other tests if problems develop. Urine tests to check for infections, diabetes, or protein in the urine. An ultrasound to confirm the proper growth and development  of the baby. An amniocentesis to check for possible genetic problems. Fetal screens for spina bifida and Down syndrome. You may need other tests to make sure you and the baby are doing well. HOME CARE INSTRUCTIONS  Medicines Follow your health care provider's instructions regarding medicine use. Specific medicines may be either safe or unsafe to take during pregnancy. Take your prenatal vitamins as directed. If you develop constipation, try taking a stool softener if your health care provider approves. Diet Eat regular, well-balanced meals. Choose a variety of foods, such as meat or vegetable-based protein, fish, milk and low-fat dairy products, vegetables, fruits, and whole grain breads and cereals. Your health care provider will help you determine the amount of weight gain that is right for you. Avoid raw meat and uncooked cheese. These carry germs that can cause birth defects in the baby. Eating four or five small meals rather than three large meals a day may help relieve nausea and vomiting. If you start to feel nauseous, eating a few soda crackers can be helpful. Drinking liquids between meals instead of during meals also seems to help nausea and vomiting. If you develop constipation, eat more high-fiber foods, such as fresh vegetables or fruit and whole grains. Drink enough fluids to keep your urine clear or pale yellow. Activity and Exercise Exercise only as directed by your health care provider. Exercising will help you: Control your weight. Stay in shape. Be prepared for labor and delivery. Experiencing pain or cramping in the lower abdomen or low back is a good sign that you should stop exercising. Check with your health care provider before continuing normal exercises. Try to avoid standing for long periods of time. Move your legs often if you must stand in one place for a long time. Avoid heavy lifting. Wear low-heeled shoes, and practice good posture. You may continue to have sex  unless your health care provider directs you otherwise. Relief of Pain or Discomfort Wear a good support bra for breast tenderness.   Take warm sitz baths to soothe any pain or discomfort caused by hemorrhoids. Use hemorrhoid cream if your health care provider approves.   Rest with your legs elevated if you have leg cramps or low back pain. If you develop varicose veins in your legs, wear support hose. Elevate your feet for 15 minutes, 3-4 times a day. Limit salt in your diet. Prenatal Care Schedule your prenatal visits by the  twelfth week of pregnancy. They are usually scheduled monthly at first, then more often in the last 2 months before delivery. Write down your questions. Take them to your prenatal visits. Keep all your prenatal visits as directed by your health care provider. Safety Wear your seat belt at all times when driving. Make a list of emergency phone numbers, including numbers for family, friends, the hospital, and police and fire departments. General Tips Ask your health care provider for a referral to a local prenatal education class. Begin classes no later than at the beginning of month 6 of your pregnancy. Ask for help if you have counseling or nutritional needs during pregnancy. Your health care provider can offer advice or refer you to specialists for help with various needs. Do not use hot tubs, steam rooms, or saunas. Do not douche or use tampons or scented sanitary pads. Do not cross your legs for long periods of time. Avoid cat litter boxes and soil used by cats. These carry germs that can cause birth defects in the baby and possibly loss of the fetus by miscarriage or stillbirth. Avoid all smoking, herbs, alcohol, and medicines not prescribed by your health care provider. Chemicals in these affect the formation and growth of the baby. Schedule a dentist appointment. At home, brush your teeth with a soft toothbrush and be gentle when you floss. SEEK MEDICAL CARE IF:   You have dizziness. You have mild pelvic cramps, pelvic pressure, or nagging pain in the abdominal area. You have persistent nausea, vomiting, or diarrhea. You have a bad smelling vaginal discharge. You have pain with urination. You notice increased swelling in your face, hands, legs, or ankles. SEEK IMMEDIATE MEDICAL CARE IF:  You have a fever. You are leaking fluid from your vagina. You have spotting or bleeding from your vagina. You have severe abdominal cramping or pain. You have rapid weight gain or loss. You vomit blood or material that looks like coffee grounds. You are exposed to German measles and have never had them. You are exposed to fifth disease or chickenpox. You develop a severe headache. You have shortness of breath. You have any kind of trauma, such as from a fall or a car accident. Document Released: 08/26/2001 Document Revised: 01/16/2014 Document Reviewed: 07/12/2013 [facility] Patient Information 2015 [place], [place]. This information is not intended to replace advice given to you by your health care provider. Make sure you discuss any questions you have with your health care provider.    Considering Waterbirth? Guide for patients at [facility]) Why consider waterbirth? Gentle birth for babies  Less pain medicine used in labor  May allow for passive descent/less pushing  May reduce perineal tears  More mobility and instinctive maternal position changes  Increased maternal relaxation   Is waterbirth safe? What are the risks of infection, drowning or other complications? Infection:  Very low risk (3.7 % for tub vs 4.8% for bed)  7 in 8000 waterbirths with documented infection  Poorly cleaned equipment most common cause  Slightly lower group B strep transmission rate  Drowning  Maternal:  Very low risk  Related to seizures or fainting  Newborn:  Very low risk. No evidence of increased risk of respiratory problems in multiple large  studies  Physiological protection from breathing under water  Avoid underwater birth if there are any fetal complications  Once baby's head is out of the water, keep it out.  Birth complication  Some reports of cord trauma, but risk  decreased by bringing baby to surface gradually  No evidence of increased risk of shoulder dystocia. Mothers can usually change positions faster in water than in a bed, possibly aiding the maneuvers to free the shoulder.   There are 2 things you MUST do to have a waterbirth with [facility]: Attend a waterbirth class at [employer] [facility]   3rd Wednesday of every month from 7-9 pm (virtual during COVID) [employer] at [email or web address] or [email or web address] or by calling [phone] Bring us  the certificate from the class to your prenatal appointment or send via MyChart Meet with a midwife at 36 weeks* to see if you can still plan a waterbirth and to sign the consent.   *We also recommend that you schedule as many of your prenatal visits with a midwife as possible.    Helpful information: You may want to bring a bathing suit top to the hospital to wear during labor but this is optional.  All other supplies are provided by the hospital. Please arrive at the hospital with signs of active labor, and do not wait at home until late in labor. It takes 45 min- 1 hour for fetal monitoring, and check in to your room to take place, plus transport and filling of the waterbirth tub.    Things that would prevent you from having a waterbirth: Premature, <37wks  Previous cesarean birth  Presence of thick meconium-stained fluid  Multiple gestation (Twins, triplets, etc.)  Uncontrolled diabetes or gestational diabetes requiring medication  Hypertension diagnosed in pregnancy or preexisting hypertension (gestational hypertension, preeclampsia, or chronic hypertension) Fetal growth restriction (your baby measures less than 10th percentile  on ultrasound) Heavy vaginal bleeding  Non-reassuring fetal heart rate  Active infection (MRSA, etc.). Group B Strep is NOT a contraindication for waterbirth.  If your labor has to be induced and induction method requires continuous monitoring of the baby's heart rate  Other risks/issues identified by your obstetrical provider   Please remember that birth is unpredictable. Under certain unforeseeable circumstances your provider may advise against giving birth in the tub. These decisions will be made on a case-by-case basis and with the safety of you and your baby as our highest priority.    Updated 12/18/21

## 2024-09-21 LAB — CBC/D/PLT+RPR+RH+ABO+RUBIGG...
Antibody Screen: NEGATIVE
Basophils Absolute: 0 x10E3/uL (ref 0.0–0.2)
Basos: 0 %
EOS (ABSOLUTE): 0.4 x10E3/uL (ref 0.0–0.4)
Eos: 3 %
HCV Ab: NONREACTIVE
HIV Screen 4th Generation wRfx: NONREACTIVE
Hematocrit: 39.9 % (ref 34.0–46.6)
Hemoglobin: 13.1 g/dL (ref 11.1–15.9)
Hepatitis B Surface Ag: NEGATIVE
Immature Grans (Abs): 0.1 x10E3/uL (ref 0.0–0.1)
Immature Granulocytes: 1 %
Lymphocytes Absolute: 2.1 x10E3/uL (ref 0.7–3.1)
Lymphs: 17 %
MCH: 28.2 pg (ref 26.6–33.0)
MCHC: 32.8 g/dL (ref 31.5–35.7)
MCV: 86 fL (ref 79–97)
Monocytes Absolute: 1 x10E3/uL — ABNORMAL HIGH (ref 0.1–0.9)
Monocytes: 8 %
Neutrophils Absolute: 9.2 x10E3/uL — ABNORMAL HIGH (ref 1.4–7.0)
Neutrophils: 71 %
Platelets: 308 x10E3/uL (ref 150–450)
RBC: 4.65 x10E6/uL (ref 3.77–5.28)
RDW: 12.6 % (ref 11.7–15.4)
RPR Ser Ql: NONREACTIVE
Rh Factor: POSITIVE
Rubella Antibodies, IGG: 1.37 {index}
WBC: 12.7 x10E3/uL — ABNORMAL HIGH (ref 3.4–10.8)

## 2024-09-21 LAB — HEMOGLOBIN A1C
Est. average glucose Bld gHb Est-mCnc: 103 mg/dL
Hgb A1c MFr Bld: 5.2 % (ref 4.8–5.6)

## 2024-09-21 LAB — HCV INTERPRETATION

## 2024-09-22 LAB — CERVICOVAGINAL ANCILLARY ONLY
Chlamydia: NEGATIVE
Comment: NEGATIVE
Comment: NORMAL
Neisseria Gonorrhea: NEGATIVE

## 2024-09-23 LAB — URINE CULTURE

## 2024-09-29 ENCOUNTER — Ambulatory Visit: Payer: Self-pay | Admitting: Women's Health

## 2024-09-29 LAB — PANORAMA PRENATAL TEST FULL PANEL:PANORAMA TEST PLUS 5 ADDITIONAL MICRODELETIONS: FETAL FRACTION: 8.3

## 2024-09-29 LAB — HORIZON CUSTOM: REPORT SUMMARY: NEGATIVE

## 2024-09-30 ENCOUNTER — Ambulatory Visit
Admission: EM | Admit: 2024-09-30 | Discharge: 2024-09-30 | Disposition: A | Discharge status: Home / Self Care | Attending: Nurse Practitioner | Admitting: Nurse Practitioner

## 2024-09-30 ENCOUNTER — Encounter: Payer: Self-pay | Admitting: Emergency Medicine

## 2024-09-30 ENCOUNTER — Other Ambulatory Visit: Payer: Self-pay

## 2024-09-30 ENCOUNTER — Encounter: Payer: Self-pay | Admitting: Women's Health

## 2024-09-30 DIAGNOSIS — J4521 Mild intermittent asthma with (acute) exacerbation: Secondary | ICD-10-CM

## 2024-09-30 DIAGNOSIS — J0140 Acute pansinusitis, unspecified: Secondary | ICD-10-CM

## 2024-09-30 LAB — POC COVID19/FLU A&B COMBO
Covid Antigen, POC: NEGATIVE
Influenza A Antigen, POC: NEGATIVE
Influenza B Antigen, POC: NEGATIVE

## 2024-09-30 MED ORDER — ALBUTEROL SULFATE (2.5 MG/3ML) 0.083% IN NEBU
2.5000 mg | INHALATION_SOLUTION | Freq: Once | RESPIRATORY_TRACT | Status: AC
  Administered 2024-09-30: 2.5 mg via RESPIRATORY_TRACT

## 2024-09-30 MED ORDER — AZITHROMYCIN 250 MG PO TABS: ORAL_TABLET | ORAL | 0 refills | Status: DC

## 2024-09-30 MED ORDER — CEFDINIR 300 MG PO CAPS: 300.0000 mg | ORAL_CAPSULE | Freq: Two times a day (BID) | ORAL | 0 refills | Status: AC

## 2024-09-30 NOTE — ED Provider Notes (Signed)
 " [facility]    CSN: [id number] Arrival date & time: 09/30/24  1404      History   Chief Complaint Chief Complaint  Patient presents with   Nasal Congestion    HPI [place] is a 24 y.o. female.   Patient presents today with 1 month history of nasal congestion, runny nose, sore throat, headache, ear pain, and fatigue.  Reports today, she started having a dry cough and some shortness of breath as well as chest tightness and upper back pain worse with inspiration.  Reports 1 month ago, she had COVID-19 and since she is newly pregnant, she did not know what medication she can take to help manage symptoms so she did not take anything.  She denies recent fever, body aches or chills, abdominal pain, nausea/vomiting, or diarrhea.  When she woke up this morning with the shortness of breath, chest tightness, and dizziness, she used her albuterol  inhaler which did seem to help with symptoms.  Reports pressure in her head is worsened today and she has felt dizzy and fuzzy while at work.  Reports symptoms are worse when she goes from sitting to standing.  Admits that she does not drink as much water as she should.  No known sick contacts recently.  Patient is currently [redacted] weeks pregnant; no vaginal bleeding.    Past Medical History:  Diagnosis Date   Abdominal pain, recurrent    Asthma    since a baby,ususally with anxiety    Bilateral pes planus    Depression    Diagnosed since 9th grade but feels like she has had it since 6th grade   Diarrhea    Headache(784.0)    Hypermobility syndrome    Diagnosed by [facility]    Migraine    Patellofemoral arthralgia of left knee    Scoliosis    Urinary tract infection    Vaginal Pap smear, abnormal     Patient Active Problem List   Diagnosis Date Noted   Supervision of normal first pregnancy 09/20/2024   Depression with anxiety 09/20/2024   Irritable bowel syndrome 06/22/2024   Esophageal dysphagia 06/22/2024   PID  (acute pelvic inflammatory disease) 03/30/2024   Abnormal Pap smear of cervix 11/03/2023   History of ovarian cyst 10/28/2023   History of trichomoniasis 10/10/2019   Slow transit constipation 10/18/2018   Chronic generalized pain 06/11/2017   GERD (gastroesophageal reflux disease) 11/07/2016   Scoliosis deformity of spine 11/07/2016   Premenstrual dysphoric syndrome 10/27/2016   Migraine without aura and without status migrainosus, not intractable 10/17/2013   Tension headache 10/17/2013    Past Surgical History:  Procedure Laterality Date   KNEE ARTHROSCOPY WITH MEDIAL PATELLAR FEMORAL LIGAMENT RECONSTRUCTION Left 07/26/2018   Procedure: LEFT KNEE ARTHROSCOPY WITH MEDIAL PATELLAR FEMORAL LIGAMENT RECONSTRUCTION;  Surgeon: Cristy Bonner DASEN, MD;  Location: [facility];  Service: Orthopedics;  Laterality: Left;   LABIOPLASTY Left 04/20/2018   Procedure: LEFT LABIAL REDUCTION;  Surgeon: Edsel Norleen GAILS, MD;  Location: [facility];  Service: Gynecology;  Laterality: Left;   URETERAL REIMPLANTION  2007   left-sided    OB History     Gravida  1   Para  0   Term  0   Preterm  0   AB  0   Living  0      SAB  0   IAB  0   Ectopic  0   Multiple  0   Live Births  0            Home Medications    Prior to Admission medications  Medication Sig Start Date End Date Taking? Authorizing Provider  azithromycin  (ZITHROMAX ) 250 MG tablet Take (2) tablets by mouth on day 1, then take (1) tablet by mouth on days 2-5. 09/30/24  Yes Chandra Harlene LABOR, NP  cefdinir  (OMNICEF ) 300 MG capsule Take 1 capsule (300 mg total) by mouth 2 (two) times daily for 7 days. 09/30/24 10/07/24 Yes Chandra Harlene LABOR, NP  Acetaminophen  (TYLENOL  PO) Take by mouth. Patient taking differently: Take by mouth as needed.    [provider]  albuterol  (VENTOLIN  HFA) 108 (90 Base) MCG/ACT inhaler Inhale 1-2 puffs into the lungs every 6 (six) hours as needed for wheezing or shortness of  breath. 11/27/22   Chandra Harlene LABOR, NP  hydrOXYzine  (ATARAX ) 25 MG tablet Take 25 mg by mouth 3 (three) times daily as needed. 05/27/24   [provider]  mirtazapine  (REMERON ) 7.5 MG tablet Take 0.5 tablets (3.75 mg total) by mouth at bedtime. 09/13/24   Kizzie Suzen SAUNDERS, CNM  Prenatal Vit-Fe Fumarate-FA (PRENATAL VITAMIN PO) Take by mouth.    [provider]  promethazine  (PHENERGAN ) 25 MG tablet Take 0.5-1 tablets (12.5-25 mg total) by mouth every 6 (six) hours as needed. 08/23/24   Kizzie Suzen SAUNDERS, CNM    Family History Family History  Problem Relation Age of Onset   Migraines Mother    Bipolar disorder Mother    Depression Mother    Anxiety disorder Mother    Fibromyalgia Mother    Irritable bowel syndrome Brother    Asthma Brother    Depression Maternal Aunt    Ulcers Maternal Grandmother    Depression Maternal Grandmother    Cancer Maternal Grandmother    Hyperlipidemia Maternal Grandmother    Fibromyalgia Maternal Grandmother    Alcohol abuse Maternal Grandfather    Ulcers Paternal Grandfather    Hypertension Paternal Grandfather    Depression Other     Social History Social History[1]   Allergies   Latex, Penicillins, Amoxicillin -pot clavulanate, and Adhesive [tape]   Review of Systems Review of Systems Per HPI  Physical Exam Triage Vital Signs ED Triage Vitals  Encounter Vitals Group     BP 09/30/24 1433 115/75     Girls Systolic BP Percentile --      Girls Diastolic BP Percentile --      Boys Systolic BP Percentile --      Boys Diastolic BP Percentile --      Pulse Rate 09/30/24 1433 94     Resp 09/30/24 1433 18     Temp 09/30/24 1433 98.8 F (37.1 C)     Temp Source 09/30/24 1433 Oral     SpO2 09/30/24 1433 99 %     Weight --      Height --      Head Circumference --      Peak Flow --      Pain Score 09/30/24 1441 6     Pain Loc --      Pain Education --      Exclude from Growth Chart --    Orthostatic VS for the  past 24 hrs:  BP- Lying Pulse- Lying BP- Sitting Pulse- Sitting BP- Standing at 0 minutes Pulse- Standing at 0 minutes  09/30/24 1444 121/54 91 111/70 96 117/81 106    Updated Vital Signs BP 115/75 (BP Location: Right Arm)   Pulse  94   Temp 98.8 F (37.1 C) (Oral)   Resp 18   LMP 06/26/2024   SpO2 99%   Visual Acuity Right Eye Distance:   Left Eye Distance:   Bilateral Distance:    Right Eye Near:   Left Eye Near:    Bilateral Near:     Physical Exam Vitals and nursing note reviewed.  Constitutional:      General: She is not in acute distress.    Appearance: Normal appearance. She is not ill-appearing or toxic-appearing.  HENT:     Head: Normocephalic and atraumatic.     Right Ear: Tympanic membrane, ear canal and external ear normal.     Left Ear: Tympanic membrane, ear canal and external ear normal.     Nose: Congestion present. No rhinorrhea.     Right Sinus: Maxillary sinus tenderness and frontal sinus tenderness present.     Left Sinus: Maxillary sinus tenderness and frontal sinus tenderness present.     Mouth/Throat:     Mouth: Mucous membranes are moist.     Pharynx: Oropharynx is clear. No oropharyngeal exudate or posterior oropharyngeal erythema.  Eyes:     General: No scleral icterus.    Extraocular Movements: Extraocular movements intact.  Cardiovascular:     Rate and Rhythm: Normal rate and regular rhythm.  Pulmonary:     Effort: Pulmonary effort is normal. No respiratory distress.     Breath sounds: Normal breath sounds. Decreased air movement present. No wheezing, rhonchi or rales.  Musculoskeletal:     Cervical back: Normal range of motion and neck supple.  Lymphadenopathy:     Cervical: No cervical adenopathy.  Skin:    General: Skin is warm and dry.     Coloration: Skin is not jaundiced or pale.     Findings: No erythema or rash.  Neurological:     Mental Status: She is alert and oriented to person, place, and time.  Psychiatric:         Behavior: Behavior is cooperative.      UC Treatments / Results  Labs (all labs ordered are listed, but only abnormal results are displayed) Labs Reviewed  POC COVID19/FLU A&B COMBO - Normal    EKG   Radiology No results found.  Procedures Procedures (including critical care time)  Medications Ordered in UC Medications  albuterol  (PROVENTIL ) (2.5 MG/3ML) 0.083% nebulizer solution 2.5 mg (2.5 mg Nebulization Given 09/30/24 1524)    Initial Impression / Assessment and Plan / UC Course  I have reviewed the triage vital signs and the nursing notes.  Pertinent labs & imaging results that were available during my care of the patient were reviewed by me and considered in my medical decision making (see chart for details).  Is a very pleasant, well-appearing 24 year old female presenting today for 1 month history of congestion and sudden onset of shortness of breath, cough, upper back pain, and dizziness.  Vital signs are stable in triage.  On exam, she has sinus tenderness to the maxillary and frontal sinuses.  Additionally, there is decreased air movement to the lungs and bilateral upper and lower lobes.  Breathing treatment is given with improvement in aeration bilaterally.  Patient has history of asthma, start using rescue inhaler scheduled every 4-6 hours for the next 2 days, then use as needed.  Given [redacted] weeks pregnant, chest x-ray deferred and will treat for community-acquired pneumonia and bacterial sinusitis with azithromycin  and cefdinir .  Patient has allergy to penicillins but has tolerated cefdinir  in  the past.  Other supportive care discussed and medications over-the-counter safe in pregnancy discussed.  ER and return precautions discussed.  Recommended follow-up with OB/GYN if symptoms do not improve with treatment. Strict ER precautions discussed if symptoms worsen.  Work excuse provided.   The patient was given the opportunity to ask questions.  All questions answered to their  satisfaction.  The patient is in agreement to this plan.   Final Clinical Impressions(s) / UC Diagnoses   Final diagnoses:  Acute non-recurrent pansinusitis  Mild intermittent asthma with acute exacerbation     Discharge Instructions      I believe you have a sinus infection.  Take the cefdinir  and azithromycin  as prescribed to treat it.  Also recommend guaifenesin  600 mg twice daily, hydration plenty of water, and nasal saline spray or rinses.  For the breathing, start using your albuterol  inhaler scheduled every 4-6 hours for the next 2 days, then use as needed.  Seek care if symptoms persist.  If symptoms worsen, please be seen in the emergency room.  OTC Medications safe in pregnancy: - acetaminophen  - cepacol throat lozenges - Coricidin HBP: chest congestion/cough, cold/flu - alcohol free cough drops - dextromethorphan - guaifenesin  - Neti pot/saline nasal rinses - Vicks vapo rub     ED Prescriptions     Medication Sig Dispense Auth. Provider   cefdinir  (OMNICEF ) 300 MG capsule Take 1 capsule (300 mg total) by mouth 2 (two) times daily for 7 days. 14 capsule Chandra Raisin A, NP   azithromycin  (ZITHROMAX ) 250 MG tablet Take (2) tablets by mouth on day 1, then take (1) tablet by mouth on days 2-5. 6 tablet Chandra Raisin LABOR, NP      PDMP not reviewed this encounter.     [1]  Social History Tobacco Use   Smoking status: Former    Current packs/day: 0.00    Types: Cigarettes    Quit date: 07/17/2019    Years since quitting: 5.2    Passive exposure: Never   Smokeless tobacco: Never   Tobacco comments:    mother vapes  Vaping Use   Vaping status: Former   Start date: 09/15/2017   Substances: Nicotine, THC, Flavoring  Substance Use Topics   Alcohol use: Not Currently    Comment: occ   Drug use: Not Currently    Types: Marijuana     Chandra Raisin LABOR, NP 09/30/24 1631  "

## 2024-09-30 NOTE — ED Triage Notes (Signed)
 Pt reports is [redacted] weeks pregnant, had covid x1 month ago and reports nasal congestion never went away. Pt reports shortness of breath, cough, and dizziness that is increased with position changes since this am. Pt reports also hurts to breath, states hx of asthma. Last use of inhaler 11am this am.NAD noted.

## 2024-09-30 NOTE — Discharge Instructions (Signed)
 I believe you have a sinus infection.  Take the cefdinir  and azithromycin  as prescribed to treat it.  Also recommend guaifenesin  600 mg twice daily, hydration plenty of water, and nasal saline spray or rinses.  For the breathing, start using your albuterol  inhaler scheduled every 4-6 hours for the next 2 days, then use as needed.  Seek care if symptoms persist.  If symptoms worsen, please be seen in the emergency room.  OTC Medications safe in pregnancy: - acetaminophen  - cepacol throat lozenges - Coricidin HBP: chest congestion/cough, cold/flu - alcohol free cough drops - dextromethorphan - guaifenesin  - Neti pot/saline nasal rinses - Vicks vapo rub

## 2024-10-12 ENCOUNTER — Other Ambulatory Visit: Payer: Self-pay | Admitting: Women's Health

## 2024-10-12 ENCOUNTER — Encounter: Payer: Self-pay | Admitting: Women's Health

## 2024-10-12 MED ORDER — OSELTAMIVIR PHOSPHATE 75 MG PO CAPS: 75.0000 mg | ORAL_CAPSULE | Freq: Two times a day (BID) | ORAL | 0 refills | Status: DC

## 2024-10-19 ENCOUNTER — Ambulatory Visit: Admitting: Advanced Practice Midwife

## 2024-10-19 VITALS — BP 116/75 | HR 98 | Wt 145.0 lb

## 2024-10-19 DIAGNOSIS — Z3402 Encounter for supervision of normal first pregnancy, second trimester: Secondary | ICD-10-CM

## 2024-10-19 DIAGNOSIS — Z3A16 16 weeks gestation of pregnancy: Secondary | ICD-10-CM | POA: Diagnosis not present

## 2024-10-19 DIAGNOSIS — Z3400 Encounter for supervision of normal first pregnancy, unspecified trimester: Secondary | ICD-10-CM

## 2024-10-19 NOTE — Patient Instructions (Signed)
 [place], thank you for choosing our office today! We appreciate the opportunity to meet your healthcare needs. You may receive a short survey by mail, e-mail, or through [employer]. If you are happy with your care we would appreciate if you could take just a few minutes to complete the survey questions. We read all of your comments and take your feedback very seriously. Thank you again for choosing our office.  Center for [employer] Team at [facility] at [facility] ([address] [place], [place] [address]) Entrance C, located off of [address] Free 24/7 valet parking  Go to [email or web address] to register for FREE online childbirth classes  Call the office [phone]) or go to [facility] if: You begin to severe cramping Your water breaks.  Sometimes it is a big gush of fluid, sometimes it is just a trickle that keeps getting your panties wet or running down your legs You have vaginal bleeding.  It is normal to have a small amount of spotting if your cervix was checked.   [facility] Pediatricians/Family Doctors [facility]): [address] Colette Ribas, [phone]           [facility]: [address], [phone]                [facility]): [address], [phone] (call to ask if accepting patients) [facility] Department: [address], [place], [phone]    [facility] Pediatricians/Family Doctors [facility]): [phone], [phone] [facility]: [address] [place], [phone] [facility]: [address], [phone]  [facility] Doctors  [facility]): [phone] [facility]: [address], [phone]   [facility] Doctors [facility]: [address] [phone]  [facility] Doctors  [facility]: [phone], [phone]  Home Blood Pressure Monitoring for Patients   Your provider has recommended that you check your blood pressure (BP) at least once a week at home. If you do not have a blood pressure cuff at home, one will be provided for you. Contact your provider if you have not received your monitor within 1 week.   Helpful Tips for Accurate Home Blood Pressure Checks  Don't smoke, exercise, or drink caffeine 30 minutes before checking your BP Use the restroom before checking your BP (a full bladder can raise your pressure) Relax in a comfortable upright chair Feet on the ground Left arm resting comfortably on a flat surface at the level of your heart Legs uncrossed Back supported Sit quietly and don't talk Place the cuff on your bare arm Adjust snuggly, so that only two fingertips can fit between your skin and the top of the cuff Check 2 readings separated by at least one minute Keep a log of your BP readings For a visual, please reference this diagram: http://ccnc.care/bpdiagram  Provider Name: [facility]     Phone: [phone]  Zone 1: ALL CLEAR  Continue to monitor your symptoms:  BP reading is less than 140 (top number) or less than 90 (bottom number)  No right upper stomach pain No headaches or seeing spots No feeling nauseated or throwing up No swelling in face and hands  Zone 2: CAUTION Call your doctor's office for any of the following:  BP reading is greater than 140 (top number) or greater than  90 (bottom number)  Stomach pain under your ribs in the middle or right side Headaches or seeing spots Feeling nauseated or throwing up Swelling in face and hands  Zone 3: EMERGENCY  Seek immediate medical care if you have any of the following:  BP reading is greater than160 (top number) or greater than 110 (bottom number) Severe headaches not improving with Tylenol Serious difficulty catching your breath Any worsening symptoms from  Zone 2     Second Trimester of Pregnancy The second trimester is from week 14 through week 27 (months 4 through 6). The second trimester is often a time when you feel your best. Your body has adjusted to being pregnant, and you begin to feel better physically. Usually, morning sickness has lessened or quit completely, you may have more energy, and you may have an increase in appetite. The second trimester is also a time when the fetus is growing rapidly. At the end of the sixth month, the fetus is about 9 inches long and weighs about 1 pounds. You will likely begin to feel the baby move (quickening) between 16 and 20 weeks of pregnancy. Body changes during your second trimester Your body continues to go through many changes during your second trimester. The changes vary from woman to woman. Your weight will continue to increase. You will notice your lower abdomen bulging out. You may begin to get stretch marks on your hips, abdomen, and breasts. You may develop headaches that can be relieved by medicines. The medicines should be approved by your health care provider. You may urinate more often because the fetus is pressing on your bladder. You may develop or continue to have heartburn as a result of your pregnancy. You may develop constipation because certain hormones are causing the muscles that push waste through your intestines to slow down. You may develop hemorrhoids or swollen, bulging veins (varicose veins). You may have back pain. This is caused by: Weight gain. Pregnancy hormones that are relaxing the joints in your pelvis. A shift in weight and the muscles that support your balance. Your breasts will continue to grow and they will continue to become tender. Your gums may bleed and may be sensitive to brushing and flossing. Dark spots or blotches (chloasma, mask of pregnancy) may develop on your face. This will likely fade after the baby is born. A dark line from your belly button to  the pubic area (linea nigra) may appear. This will likely fade after the baby is born. You may have changes in your hair. These can include thickening of your hair, rapid growth, and changes in texture. Some women also have hair loss during or after pregnancy, or hair that feels dry or thin. Your hair will most likely return to normal after your baby is born.  What to expect at prenatal visits During a routine prenatal visit: You will be weighed to make sure you and the fetus are growing normally. Your blood pressure will be taken. Your abdomen will be measured to track your baby's growth. The fetal heartbeat will be listened to. Any test results from the previous visit will be discussed.  Your health care provider may ask you: How you are feeling. If you are feeling the baby move. If you have had any abnormal symptoms, such as leaking fluid, bleeding, severe headaches, or abdominal cramping. If you are using any tobacco products, including cigarettes, chewing tobacco, and electronic cigarettes. If you have any questions.  Other tests that may be performed during  your second trimester include: Blood tests that check for: Low iron levels (anemia). High blood sugar that affects pregnant women (gestational diabetes) between 82 and 28 weeks. Rh antibodies. This is to check for a protein on red blood cells (Rh factor). Urine tests to check for infections, diabetes, or protein in the urine. An ultrasound to confirm the proper growth and development of the baby. An amniocentesis to check for possible genetic problems. Fetal screens for spina bifida and Down syndrome. HIV (human immunodeficiency virus) testing. Routine prenatal testing includes screening for HIV, unless you choose not to have this test.  Follow these instructions at home: Medicines Follow your health care provider's instructions regarding medicine use. Specific medicines may be either safe or unsafe to take during  pregnancy. Take a prenatal vitamin that contains at least 600 micrograms (mcg) of folic acid. If you develop constipation, try taking a stool softener if your health care provider approves. Eating and drinking Eat a balanced diet that includes fresh fruits and vegetables, whole grains, good sources of protein such as meat, eggs, or tofu, and low-fat dairy. Your health care provider will help you determine the amount of weight gain that is right for you. Avoid raw meat and uncooked cheese. These carry germs that can cause birth defects in the baby. If you have low calcium intake from food, talk to your health care provider about whether you should take a daily calcium supplement. Limit foods that are high in fat and processed sugars, such as fried and sweet foods. To prevent constipation: Drink enough fluid to keep your urine clear or pale yellow. Eat foods that are high in fiber, such as fresh fruits and vegetables, whole grains, and beans. Activity Exercise only as directed by your health care provider. Most women can continue their usual exercise routine during pregnancy. Try to exercise for 30 minutes at least 5 days a week. Stop exercising if you experience uterine contractions. Avoid heavy lifting, wear low heel shoes, and practice good posture. A sexual relationship may be continued unless your health care provider directs you otherwise. Relieving pain and discomfort Wear a good support bra to prevent discomfort from breast tenderness. Take warm sitz baths to soothe any pain or discomfort caused by hemorrhoids. Use hemorrhoid cream if your health care provider approves. Rest with your legs elevated if you have leg cramps or low back pain. If you develop varicose veins, wear support hose. Elevate your feet for 15 minutes, 3-4 times a day. Limit salt in your diet. Prenatal Care Write down your questions. Take them to your prenatal visits. Keep all your prenatal visits as told by your health  care provider. This is important. Safety Wear your seat belt at all times when driving. Make a list of emergency phone numbers, including numbers for family, friends, the hospital, and police and fire departments. General instructions Ask your health care provider for a referral to a local prenatal education class. Begin classes no later than the beginning of month 6 of your pregnancy. Ask for help if you have counseling or nutritional needs during pregnancy. Your health care provider can offer advice or refer you to specialists for help with various needs. Do not use hot tubs, steam rooms, or saunas. Do not douche or use tampons or scented sanitary pads. Do not cross your legs for long periods of time. Avoid cat litter boxes and soil used by cats. These carry germs that can cause birth defects in the baby and possibly loss of the  fetus by miscarriage or stillbirth. Avoid all smoking, herbs, alcohol, and unprescribed drugs. Chemicals in these products can affect the formation and growth of the baby. Do not use any products that contain nicotine or tobacco, such as cigarettes and e-cigarettes. If you need help quitting, ask your health care provider. Visit your dentist if you have not gone yet during your pregnancy. Use a soft toothbrush to brush your teeth and be gentle when you floss. Contact a health care provider if: You have dizziness. You have mild pelvic cramps, pelvic pressure, or nagging pain in the abdominal area. You have persistent nausea, vomiting, or diarrhea. You have a bad smelling vaginal discharge. You have pain when you urinate. Get help right away if: You have a fever. You are leaking fluid from your vagina. You have spotting or bleeding from your vagina. You have severe abdominal cramping or pain. You have rapid weight gain or weight loss. You have shortness of breath with chest pain. You notice sudden or extreme swelling of your face, hands, ankles, feet, or legs. You  have not felt your baby move in over an hour. You have severe headaches that do not go away when you take medicine. You have vision changes. Summary The second trimester is from week 14 through week 27 (months 4 through 6). It is also a time when the fetus is growing rapidly. Your body goes through many changes during pregnancy. The changes vary from woman to woman. Avoid all smoking, herbs, alcohol, and unprescribed drugs. These chemicals affect the formation and growth your baby. Do not use any tobacco products, such as cigarettes, chewing tobacco, and e-cigarettes. If you need help quitting, ask your health care provider. Contact your health care provider if you have any questions. Keep all prenatal visits as told by your health care provider. This is important. This information is not intended to replace advice given to you by your health care provider. Make sure you discuss any questions you have with your health care provider. Document Released: 08/26/2001 Document Revised: 02/07/2016 Document Reviewed: 11/02/2012 Elsevier Interactive Patient Education  2017 [employer].

## 2024-10-19 NOTE — Progress Notes (Signed)
" ° °  LOW-RISK PREGNANCY VISIT Patient name: Victoria Key MRN [id number]  Date of birth: 2001-08-20 Chief Complaint:   Routine Prenatal Visit  History of Present Illness:   Victoria Key is a 24 y.o. G93P0000 female at [redacted]w[redacted]d with an Estimated Date of Delivery: 04/02/25 being seen today for ongoing management of a low-risk pregnancy.  Today she reports had flu/covid in Jan and still has congestion that wakes her up at night. Contractions: Irritability. Vag. Bleeding: None.  Movement: Present. denies leaking of fluid. Review of Systems:   Pertinent items are noted in HPI Denies abnormal vaginal discharge w/ itching/odor/irritation, headaches, visual changes, shortness of breath, chest pain, abdominal pain, severe nausea/vomiting, or problems with urination or bowel movements unless otherwise stated above. Pertinent History Reviewed:  Reviewed past medical,surgical, social, obstetrical and family history.  Reviewed problem list, medications and allergies. Physical Assessment:   Vitals:   10/19/24 1557  BP: 116/75  Pulse: 98  Weight: 145 lb (65.8 kg)  Body mass index is 26.95 kg/m.        Physical Examination:   General appearance: Well appearing, and in no distress  Mental status: Alert, oriented to person, place, and time  Skin: Warm & dry  Cardiovascular: Normal heart rate noted  Respiratory: Normal respiratory effort, no distress  Abdomen: Soft, gravid, nontender  Pelvic: Cervical exam deferred         Extremities:    Fetal Status: Fetal Heart Rate (bpm): 147   Movement: Present    No results found for this or any previous visit (from the past 24 hours).  Assessment & Plan:  1) Low-risk pregnancy G1P0000 at [redacted]w[redacted]d with an Estimated Date of Delivery: 04/02/25   2) Congestion s/p flu/covid, recommended Mucinex  as directed and humidifier  3) Interested in systems developer.  No contraindications at this time per chart review/patient assessment.   - Pt to enroll in class, see CNMs for  most visits in the office.  - Discussed waterbirth as option for low-risk pregnancy.  Reviewed conditions that may arise during pregnancy that will risk pt out of waterbirth including hypertension, diabetes, fetal growth restriction <10%ile, etc.   4) Anx/dep, on Vistaril  and Remeron ; msg sent for [facility] appt @ last visit; in Epic has appt on 02/13/25 (not sure if this was first available?)   Meds: No orders of the defined types were placed in this encounter.  Labs/procedures today: AFP  Plan:  Continue routine obstetrical care   Reviewed: Preterm labor symptoms and general obstetric precautions including but not limited to vaginal bleeding, contractions, leaking of fluid and fetal movement were reviewed in detail with the patient.  All questions were answered. Has home bp cuff.  Check bp weekly, let us  know if >140/90.   Follow-up: Return for add Pap to next visit, As scheduled.(Anatomy u/s)  Orders Placed This Encounter  Procedures   AFP, Serum, Open Spina Bifida   Suzen JONETTA Gentry [facility] 10/19/2024 4:29 PM  "

## 2024-10-21 LAB — AFP, SERUM, OPEN SPINA BIFIDA
AFP MoM: 1.13
AFP Value: 38 ng/mL
Gest. Age on Collection Date: 16 wk
Maternal Age At EDD: 24.1 a
OSBR Risk 1 IN: 8106
Test Results:: NEGATIVE
Weight: 145 [lb_av]

## 2024-11-14 ENCOUNTER — Encounter: Admitting: Advanced Practice Midwife

## 2024-11-14 ENCOUNTER — Other Ambulatory Visit

## 2025-02-13 ENCOUNTER — Ambulatory Visit (HOSPITAL_COMMUNITY): Payer: Self-pay | Admitting: Registered Nurse
